# Patient Record
Sex: Male | Born: 1980 | Hispanic: Yes | Marital: Married | Smoking: Never smoker
Health system: Southern US, Community
[De-identification: ages and names within clinical notes are randomized; demographics above are authoritative.]

## PROBLEM LIST (undated history)

## (undated) DIAGNOSIS — N401 Enlarged prostate with lower urinary tract symptoms: Secondary | ICD-10-CM

## (undated) DIAGNOSIS — R519 Headache, unspecified: Secondary | ICD-10-CM

## (undated) DIAGNOSIS — N529 Male erectile dysfunction, unspecified: Secondary | ICD-10-CM

## (undated) DIAGNOSIS — N138 Other obstructive and reflux uropathy: Secondary | ICD-10-CM

## (undated) DIAGNOSIS — G4730 Sleep apnea, unspecified: Secondary | ICD-10-CM

## (undated) DIAGNOSIS — E291 Testicular hypofunction: Secondary | ICD-10-CM

## (undated) DIAGNOSIS — J40 Bronchitis, not specified as acute or chronic: Secondary | ICD-10-CM

## (undated) HISTORY — DX: Other obstructive and reflux uropathy: N13.8

## (undated) HISTORY — DX: Testicular hypofunction: E29.1

## (undated) HISTORY — DX: Male erectile dysfunction, unspecified: N52.9

## (undated) HISTORY — DX: Benign prostatic hyperplasia with lower urinary tract symptoms: N40.1

## (undated) HISTORY — PX: NO PAST SURGERIES: SHX2092

---

## 2010-10-26 ENCOUNTER — Emergency Department: Payer: Self-pay | Admitting: Emergency Medicine

## 2010-12-17 ENCOUNTER — Ambulatory Visit: Payer: Self-pay | Admitting: Neurology

## 2010-12-23 ENCOUNTER — Ambulatory Visit: Payer: Self-pay | Admitting: Neurology

## 2011-04-14 ENCOUNTER — Ambulatory Visit: Payer: Self-pay | Admitting: Neurology

## 2011-06-01 ENCOUNTER — Ambulatory Visit: Payer: Self-pay

## 2011-06-26 ENCOUNTER — Ambulatory Visit: Payer: Self-pay | Admitting: Internal Medicine

## 2011-06-26 LAB — RAPID STREP-A WITH REFLX: Micro Text Report: NEGATIVE

## 2011-06-28 LAB — BETA STREP CULTURE(ARMC)

## 2012-08-17 ENCOUNTER — Ambulatory Visit: Payer: Self-pay | Admitting: Internal Medicine

## 2012-08-17 LAB — COMPREHENSIVE METABOLIC PANEL
Anion Gap: 14 (ref 7–16)
BUN: 8 mg/dL (ref 7–18)
Calcium, Total: 9.3 mg/dL (ref 8.5–10.1)
Co2: 22 mmol/L (ref 21–32)
Creatinine: 1.16 mg/dL (ref 0.60–1.30)
EGFR (African American): >60
Glucose: 92 mg/dL (ref 65–99)
Osmolality: 277 (ref 275–301)
Potassium: 3.5 mmol/L (ref 3.5–5.1)
Sodium: 140 mmol/L (ref 136–145)

## 2012-08-17 LAB — CBC CANCER CENTER
Basophil #: 0 x10 3/mm (ref 0.0–0.1)
Basophil %: 0.4 %
Eosinophil #: 0.2 x10 3/mm (ref 0.0–0.7)
Eosinophil %: 4.7 %
HCT: 44.4 % (ref 40.0–52.0)
HGB: 14.8 g/dL (ref 13.0–18.0)
MCH: 28.4 pg (ref 26.0–34.0)
MCV: 85 fL (ref 80–100)
Monocyte #: 0.6 x10 3/mm (ref 0.2–1.0)
Monocyte %: 14.6 %
Neutrophil #: 1.7 x10 3/mm (ref 1.4–6.5)
Neutrophil %: 41.4 %
Platelet: 269 x10 3/mm (ref 150–440)
RBC: 5.22 10*6/uL (ref 4.40–5.90)
WBC: 4.1 x10 3/mm (ref 3.8–10.6)

## 2012-08-17 LAB — IRON AND TIBC
Iron Bind.Cap.(Total): 344 ug/dL (ref 250–450)
Iron: 107 ug/dL (ref 65–175)

## 2012-08-17 LAB — FERRITIN: Ferritin (ARMC): 64 ng/mL (ref 8–388)

## 2012-08-18 ENCOUNTER — Ambulatory Visit: Payer: Self-pay

## 2012-08-18 LAB — HEPATIC FUNCTION PANEL A (ARMC)
Bilirubin, Direct: 0.1 mg/dL (ref 0.00–0.20)
Total Protein: 7.9 g/dL (ref 6.4–8.2)

## 2012-08-24 LAB — CBC CANCER CENTER
Basophil #: 0 x10 3/mm (ref 0.0–0.1)
Eosinophil #: 0.2 x10 3/mm (ref 0.0–0.7)
Eosinophil %: 3.1 %
HCT: 44.6 % (ref 40.0–52.0)
HGB: 14.9 g/dL (ref 13.0–18.0)
Lymphocyte #: 1.4 x10 3/mm (ref 1.0–3.6)
Lymphocyte %: 28.6 %
MCH: 28 pg (ref 26.0–34.0)
MCV: 84 fL (ref 80–100)
Monocyte %: 11.1 %
Neutrophil #: 2.8 x10 3/mm (ref 1.4–6.5)
Neutrophil %: 56.8 %
Platelet: 224 x10 3/mm (ref 150–440)
RBC: 5.32 10*6/uL (ref 4.40–5.90)
RDW: 14.1 % (ref 11.5–14.5)

## 2012-09-04 ENCOUNTER — Ambulatory Visit: Payer: Self-pay | Admitting: Internal Medicine

## 2013-03-05 ENCOUNTER — Ambulatory Visit: Payer: Self-pay | Admitting: Internal Medicine

## 2015-01-16 DIAGNOSIS — G4700 Insomnia, unspecified: Secondary | ICD-10-CM | POA: Insufficient documentation

## 2015-01-16 DIAGNOSIS — G43019 Migraine without aura, intractable, without status migrainosus: Secondary | ICD-10-CM | POA: Insufficient documentation

## 2015-01-16 DIAGNOSIS — G4733 Obstructive sleep apnea (adult) (pediatric): Secondary | ICD-10-CM | POA: Insufficient documentation

## 2015-05-16 ENCOUNTER — Encounter: Payer: Self-pay | Admitting: *Deleted

## 2015-05-21 ENCOUNTER — Encounter: Payer: Self-pay | Admitting: Urology

## 2015-05-21 ENCOUNTER — Ambulatory Visit (INDEPENDENT_AMBULATORY_CARE_PROVIDER_SITE_OTHER): Payer: BLUE CROSS/BLUE SHIELD | Admitting: Urology

## 2015-05-21 VITALS — BP 123/72 | HR 89 | Ht 71.0 in | Wt 238.9 lb

## 2015-05-21 DIAGNOSIS — F528 Other sexual dysfunction not due to a substance or known physiological condition: Secondary | ICD-10-CM

## 2015-05-21 DIAGNOSIS — M1990 Unspecified osteoarthritis, unspecified site: Secondary | ICD-10-CM | POA: Insufficient documentation

## 2015-05-21 DIAGNOSIS — N401 Enlarged prostate with lower urinary tract symptoms: Secondary | ICD-10-CM | POA: Diagnosis not present

## 2015-05-21 DIAGNOSIS — Z8639 Personal history of other endocrine, nutritional and metabolic disease: Secondary | ICD-10-CM | POA: Insufficient documentation

## 2015-05-21 DIAGNOSIS — F5221 Male erectile disorder: Secondary | ICD-10-CM | POA: Insufficient documentation

## 2015-05-21 DIAGNOSIS — N138 Other obstructive and reflux uropathy: Secondary | ICD-10-CM
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
## 2015-05-27 ENCOUNTER — Other Ambulatory Visit: Payer: Self-pay

## 2015-05-27 DIAGNOSIS — E291 Testicular hypofunction: Secondary | ICD-10-CM

## 2015-05-28 ENCOUNTER — Other Ambulatory Visit: Payer: BLUE CROSS/BLUE SHIELD

## 2015-05-28 DIAGNOSIS — E291 Testicular hypofunction: Secondary | ICD-10-CM

## 2015-05-29 ENCOUNTER — Telehealth: Payer: Self-pay

## 2015-05-29 LAB — TESTOSTERONE: TESTOSTERONE: 764 ng/dL (ref 348–1197)

## 2015-05-29 NOTE — Telephone Encounter (Signed)
Called to speak with pt in reference to lab results. Made pt aware testosterone was within normal limits therefore he did not have hypogonadism. Pt stated that was not true and hung up.

## 2015-05-29 NOTE — Telephone Encounter (Signed)
-----   Message from Harle Battiest, PA-C sent at 05/29/2015 12:09 PM EST ----- Patient's testosterone is in normal limits. He does not have hypogonadism.

## 2015-06-18 ENCOUNTER — Other Ambulatory Visit: Payer: Self-pay | Admitting: Urology

## 2015-08-19 ENCOUNTER — Encounter: Payer: Self-pay | Admitting: *Deleted

## 2015-08-19 ENCOUNTER — Ambulatory Visit
Admission: EM | Admit: 2015-08-19 | Discharge: 2015-08-19 | Disposition: A | Discharge status: Home / Self Care | Payer: BLUE CROSS/BLUE SHIELD | Attending: Emergency Medicine | Admitting: Emergency Medicine

## 2015-08-19 DIAGNOSIS — H6592 Unspecified nonsuppurative otitis media, left ear: Secondary | ICD-10-CM | POA: Diagnosis not present

## 2015-08-19 MED ORDER — MOMETASONE FUROATE 50 MCG/ACT NA SUSP: 2.0000 | Freq: Every day | NASAL | Status: DC

## 2015-08-19 MED ORDER — AMOXICILLIN 875 MG PO TABS: 875.0000 mg | ORAL_TABLET | Freq: Two times a day (BID) | ORAL | Status: DC

## 2015-08-19 NOTE — ED Notes (Signed)
Patient started having symptoms of nasal congestion, cough, and left ear pain. Today his left ear has become more painful. Patient reports having a sinus infection 3 years ago and having left ear pain symptoms at that time.
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
## 2015-11-03 ENCOUNTER — Ambulatory Visit
Admission: EM | Admit: 2015-11-03 | Discharge: 2015-11-03 | Disposition: A | Discharge status: Home / Self Care | Payer: BLUE CROSS/BLUE SHIELD | Attending: Emergency Medicine | Admitting: Emergency Medicine

## 2015-11-03 ENCOUNTER — Ambulatory Visit (INDEPENDENT_AMBULATORY_CARE_PROVIDER_SITE_OTHER): Payer: BLUE CROSS/BLUE SHIELD

## 2015-11-03 DIAGNOSIS — M25462 Effusion, left knee: Secondary | ICD-10-CM

## 2015-11-03 DIAGNOSIS — S8392XA Sprain of unspecified site of left knee, initial encounter: Secondary | ICD-10-CM

## 2015-11-03 MED ORDER — NAPROXEN 500 MG PO TABS: 500.0000 mg | ORAL_TABLET | Freq: Two times a day (BID) | ORAL | 0 refills | Status: DC
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
## 2016-06-10 ENCOUNTER — Other Ambulatory Visit: Payer: Self-pay | Admitting: Urology

## 2016-06-10 DIAGNOSIS — N401 Enlarged prostate with lower urinary tract symptoms: Secondary | ICD-10-CM

## 2016-06-10 NOTE — Telephone Encounter (Signed)
Pharmacy sent a refill request of Cilias 5mg . 30 days and no refills were given as pt needs an appt!!!

## 2016-07-20 ENCOUNTER — Other Ambulatory Visit: Payer: Self-pay | Admitting: Urology

## 2016-07-20 DIAGNOSIS — N401 Enlarged prostate with lower urinary tract symptoms: Secondary | ICD-10-CM
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
## 2016-07-28 ENCOUNTER — Ambulatory Visit: Payer: 59 | Admitting: Urology

## 2016-07-28 ENCOUNTER — Encounter: Payer: Self-pay | Admitting: Urology

## 2016-07-28 VITALS — BP 126/83 | HR 86 | Ht 71.0 in | Wt 234.9 lb

## 2016-07-28 DIAGNOSIS — N138 Other obstructive and reflux uropathy: Secondary | ICD-10-CM

## 2016-07-28 DIAGNOSIS — F5221 Male erectile disorder: Secondary | ICD-10-CM

## 2016-07-28 DIAGNOSIS — N401 Enlarged prostate with lower urinary tract symptoms: Secondary | ICD-10-CM | POA: Diagnosis not present

## 2016-07-28 DIAGNOSIS — E349 Endocrine disorder, unspecified: Secondary | ICD-10-CM | POA: Diagnosis not present

## 2016-07-28 MED ORDER — TADALAFIL 5 MG PO TABS: 5.0000 mg | ORAL_TABLET | Freq: Every day | ORAL | 11 refills | Status: DC | PRN

## 2016-07-31 ENCOUNTER — Other Ambulatory Visit: Payer: Self-pay

## 2016-07-31 ENCOUNTER — Other Ambulatory Visit: Payer: 59

## 2016-07-31 DIAGNOSIS — E291 Testicular hypofunction: Secondary | ICD-10-CM

## 2016-08-01 LAB — TESTOSTERONE: TESTOSTERONE: 620 ng/dL (ref 264–916)

## 2016-08-03 ENCOUNTER — Telehealth: Payer: Self-pay

## 2016-08-03 NOTE — Telephone Encounter (Signed)
Spoke with pt in reference to lab results. Pt voiced understanding.  

## 2016-08-03 NOTE — Telephone Encounter (Signed)
-----   Message from Harle Battiest, PA-C sent at 08/01/2016 11:23 AM EDT ----- Please let the patient know that his testosterone level is excellent.  This is not the source of his fatigue and/or ED.

## 2017-01-26 ENCOUNTER — Telehealth: Payer: Self-pay | Admitting: Urology

## 2017-01-26 NOTE — Telephone Encounter (Signed)
Pt called [facility] in [place] and they told him they are waiting on a PA for Cialis.

## 2017-01-28 NOTE — Telephone Encounter (Signed)
Prior authorization APPROVED through Cover My Meds (Reference # [id number]).    Per Fleet Contrasachel at [facility] in [place], medication is ready for pick up at a cost of $25.  LMOM to notify patient.

## 2017-05-17 ENCOUNTER — Ambulatory Visit (INDEPENDENT_AMBULATORY_CARE_PROVIDER_SITE_OTHER): Payer: 59

## 2017-05-17 ENCOUNTER — Other Ambulatory Visit: Payer: Self-pay

## 2017-05-17 ENCOUNTER — Encounter: Payer: Self-pay | Admitting: *Deleted

## 2017-05-17 ENCOUNTER — Ambulatory Visit
Admission: EM | Admit: 2017-05-17 | Discharge: 2017-05-17 | Disposition: A | Discharge status: Home / Self Care | Payer: 59 | Attending: Family Medicine | Admitting: Family Medicine

## 2017-05-17 DIAGNOSIS — S61431A Puncture wound without foreign body of right hand, initial encounter: Secondary | ICD-10-CM | POA: Diagnosis not present

## 2017-05-17 DIAGNOSIS — Z23 Encounter for immunization: Secondary | ICD-10-CM

## 2017-05-17 DIAGNOSIS — B351 Tinea unguium: Secondary | ICD-10-CM

## 2017-05-17 DIAGNOSIS — M79672 Pain in left foot: Secondary | ICD-10-CM

## 2017-05-17 DIAGNOSIS — W450XXA Nail entering through skin, initial encounter: Secondary | ICD-10-CM | POA: Diagnosis not present

## 2017-05-17 MED ORDER — TETANUS-DIPHTH-ACELL PERTUSSIS 5-2.5-18.5 LF-MCG/0.5 IM SUSP
0.5000 mL | Freq: Once | INTRAMUSCULAR | Status: AC
  Administered 2017-05-17: 0.5 mL via INTRAMUSCULAR

## 2017-05-17 MED ORDER — NAPROXEN 500 MG PO TABS: 500.0000 mg | ORAL_TABLET | Freq: Two times a day (BID) | ORAL | 0 refills | Status: DC | PRN

## 2017-05-17 NOTE — ED Triage Notes (Signed)
Patient started having unexplained left foot pain 2 days ago. Patient punctured palm of right hand with a nail yesterday.

## 2017-05-17 NOTE — Discharge Instructions (Addendum)
You were given a Tetanus booster today. May start Naproxen 500mg  twice a day as directed for foot pain. May use warm compresses and alternate with ice as needed for comfort. Recommend continue to clean puncture wound with soap and water. May apply triple antibiotic ointment and cover with bandage. If any redness, increased pain, discharge or warmth occur, return here for recheck. Otherwise, follow-up with Dr. Ether GriffinsFowler for foot pain and treatment.
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
## 2017-05-20 ENCOUNTER — Telehealth: Payer: Self-pay | Admitting: *Deleted

## 2017-07-19 ENCOUNTER — Other Ambulatory Visit: Payer: Self-pay | Admitting: Urology

## 2017-07-20 ENCOUNTER — Telehealth: Payer: Self-pay | Admitting: Urology

## 2017-07-20 MED ORDER — TADALAFIL 5 MG PO TABS: 5.0000 mg | ORAL_TABLET | Freq: Every day | ORAL | 0 refills | Status: DC | PRN

## 2017-07-20 NOTE — Addendum Note (Signed)
Addended by: Honor LohGARRISON, Emiya Loomer M on: 07/20/2017 02:42 PM   Modules accepted: Orders

## 2017-07-20 NOTE — Telephone Encounter (Signed)
Spoke to patient. He made a follow up appointment for May 16. I gave him 1 month refill on Cialis until his appointment day.

## 2017-07-20 NOTE — Telephone Encounter (Signed)
Patient does not have a yearly follow up scheduled and is requesting refills on his Cialis. He needs to schedule a follow up appointment and then we can give him refills to last until his follow up appointment.

## 2017-07-20 NOTE — Telephone Encounter (Signed)
Medication refilled, OV scheduled

## 2017-07-20 NOTE — Telephone Encounter (Signed)
Pt called office asking for refill on medication, states pharmacy sent refill request.  Please advise.  Thanks.

## 2017-08-18 ENCOUNTER — Other Ambulatory Visit: Payer: Self-pay | Admitting: Urology

## 2017-08-18 ENCOUNTER — Telehealth: Payer: Self-pay | Admitting: Urology

## 2017-08-18 MED ORDER — TADALAFIL 5 MG PO TABS: 5.0000 mg | ORAL_TABLET | Freq: Every day | ORAL | 0 refills | Status: DC | PRN

## 2017-08-18 NOTE — Progress Notes (Signed)
Refill for Cialis sent to pharmacy
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
## 2017-08-18 NOTE — Telephone Encounter (Signed)
We moved the patient's follow up app out til June but he wants to know if he can get his refill for his Cialis called in to the [facility]?  Thanks, Marcelino Duster

## 2017-08-19 ENCOUNTER — Ambulatory Visit: Payer: Self-pay | Admitting: Urology

## 2017-09-14 ENCOUNTER — Telehealth: Payer: Self-pay | Admitting: Urology

## 2017-09-16 ENCOUNTER — Ambulatory Visit (INDEPENDENT_AMBULATORY_CARE_PROVIDER_SITE_OTHER): Payer: 59 | Admitting: Urology

## 2017-09-16 ENCOUNTER — Encounter: Payer: Self-pay | Admitting: Urology

## 2017-09-16 VITALS — BP 121/73 | HR 88 | Ht 71.0 in | Wt 253.9 lb

## 2017-09-16 DIAGNOSIS — N529 Male erectile dysfunction, unspecified: Secondary | ICD-10-CM | POA: Diagnosis not present

## 2017-09-16 DIAGNOSIS — N138 Other obstructive and reflux uropathy: Secondary | ICD-10-CM | POA: Diagnosis not present

## 2017-09-16 DIAGNOSIS — N401 Enlarged prostate with lower urinary tract symptoms: Secondary | ICD-10-CM | POA: Diagnosis not present

## 2017-09-16 MED ORDER — TADALAFIL 5 MG PO TABS: 5.0000 mg | ORAL_TABLET | Freq: Every day | ORAL | 11 refills | Status: DC | PRN
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
## 2017-09-17 ENCOUNTER — Ambulatory Visit: Payer: Self-pay | Admitting: Urology

## 2017-09-30 ENCOUNTER — Ambulatory Visit: Payer: Self-pay | Admitting: Urology

## 2017-10-05 ENCOUNTER — Ambulatory Visit
Admission: EM | Admit: 2017-10-05 | Discharge: 2017-10-05 | Disposition: A | Discharge status: Home / Self Care | Payer: 59 | Attending: Family Medicine | Admitting: Family Medicine

## 2017-10-05 ENCOUNTER — Other Ambulatory Visit: Payer: Self-pay

## 2017-10-05 ENCOUNTER — Encounter: Payer: Self-pay | Admitting: Emergency Medicine

## 2017-10-05 DIAGNOSIS — M25512 Pain in left shoulder: Secondary | ICD-10-CM | POA: Diagnosis not present

## 2017-10-05 MED ORDER — MELOXICAM 15 MG PO TABS: 15.0000 mg | ORAL_TABLET | Freq: Every day | ORAL | 0 refills | Status: DC | PRN
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
## 2017-10-05 NOTE — ED Triage Notes (Signed)
Patient c/o left shoulder pain that goes down his left arm that has been going on for a week.  Patient denies chest pain or SOB. Patient denies injury or fall.

## 2017-10-05 NOTE — Discharge Instructions (Addendum)
Likely biceps tendonitis and rotator cuff bursitis and/or tendonitis.  Medication as prescribed. Do not use other NSAID's while using it.  If persists, see Ortho ([facility]).  Take care  Dr. Adriana Simasook

## 2017-10-28 ENCOUNTER — Other Ambulatory Visit: Payer: Self-pay | Admitting: Family Medicine

## 2017-11-18 ENCOUNTER — Other Ambulatory Visit: Payer: Self-pay | Admitting: Family Medicine

## 2018-01-31 ENCOUNTER — Ambulatory Visit
Admission: EM | Admit: 2018-01-31 | Discharge: 2018-01-31 | Disposition: A | Discharge status: Home / Self Care | Payer: 59 | Attending: Family Medicine | Admitting: Family Medicine

## 2018-01-31 ENCOUNTER — Other Ambulatory Visit: Payer: Self-pay

## 2018-01-31 DIAGNOSIS — B370 Candidal stomatitis: Secondary | ICD-10-CM

## 2018-01-31 MED ORDER — NYSTATIN 100000 UNIT/ML MT SUSP
500000.0000 [IU] | Freq: Four times a day (QID) | OROMUCOSAL | 0 refills | Status: DC
Start: 2018-01-31 — End: 2018-04-01

## 2018-01-31 NOTE — Discharge Instructions (Signed)
Follow up with primary care for routine physical and labs

## 2018-01-31 NOTE — ED Triage Notes (Signed)
Patient complains of pain on tongue. Patient states that he has been noticing this for a month or longer. States that he has a sensitivity like area at the back of his tongue.
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
## 2018-02-15 ENCOUNTER — Telehealth: Payer: Self-pay

## 2018-02-15 ENCOUNTER — Telehealth: Payer: Self-pay | Admitting: Urology

## 2018-02-15 NOTE — Telephone Encounter (Signed)
Prior authorization for tadalafil 5mg  was initiated via covermymeds.  Waiting response.

## 2018-02-15 NOTE — Telephone Encounter (Signed)
[facility] in [place] is waiting on a prior auth for Cialis.  Pt is completely out.

## 2018-02-16 NOTE — Telephone Encounter (Signed)
Patient picked up cialis rx using discount card.

## 2018-03-15 ENCOUNTER — Telehealth: Payer: Self-pay | Admitting: Urology

## 2018-03-15 NOTE — Telephone Encounter (Signed)
Prior authorization for cialis has been reprocessed through covermymeds.  Waiting response.

## 2018-03-15 NOTE — Telephone Encounter (Signed)
Insurance company denied pt's claim for Cialis because the claim didn't have BPH on it.  Please call insurance company or do electronically.  They have been back and forth for about a month.  Pt would like for it either to be called in or sent electronically with BPH added.  Insurance co# [phone] case# [id number]

## 2018-03-16 ENCOUNTER — Telehealth: Payer: Self-pay

## 2018-03-16 NOTE — Telephone Encounter (Signed)
Cialis has been approved.  ID [id number].  Patient aware.

## 2018-03-16 NOTE — Telephone Encounter (Signed)
Cialis approved.  Patient aware.

## 2018-04-01 ENCOUNTER — Ambulatory Visit
Admission: EM | Admit: 2018-04-01 | Discharge: 2018-04-01 | Disposition: A | Discharge status: Home / Self Care | Payer: 59 | Attending: Family Medicine | Admitting: Family Medicine

## 2018-04-01 ENCOUNTER — Encounter: Payer: Self-pay | Admitting: Gynecology

## 2018-04-01 DIAGNOSIS — J40 Bronchitis, not specified as acute or chronic: Secondary | ICD-10-CM | POA: Insufficient documentation

## 2018-04-01 MED ORDER — PREDNISONE 20 MG PO TABS: 40.0000 mg | ORAL_TABLET | Freq: Every day | ORAL | 0 refills | Status: AC

## 2018-04-01 MED ORDER — AZITHROMYCIN 250 MG PO TABS: 250.0000 mg | ORAL_TABLET | Freq: Every day | ORAL | 0 refills | Status: DC

## 2018-04-01 MED ORDER — BENZONATATE 100 MG PO CAPS: 100.0000 mg | ORAL_CAPSULE | Freq: Three times a day (TID) | ORAL | 0 refills | Status: DC | PRN

## 2018-04-01 MED ORDER — ALBUTEROL SULFATE 108 (90 BASE) MCG/ACT IN AEPB: 1.0000 | INHALATION_SPRAY | Freq: Four times a day (QID) | RESPIRATORY_TRACT | 0 refills | Status: DC | PRN

## 2018-04-01 NOTE — ED Triage Notes (Signed)
Patient c/o cold symptoms x 1 week. Per patient with cough / chest congestion.
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
## 2018-04-01 NOTE — Discharge Instructions (Signed)
Take medications as prescribed. Also start Mucinex DM 12-hour formula twice a day. This medication is available over the counter. You may take tylenol or ibuprofen as needed. Drink plenty of fluids.

## 2018-04-22 NOTE — Telephone Encounter (Signed)
error 

## 2018-09-05 ENCOUNTER — Other Ambulatory Visit: Payer: Self-pay | Admitting: Urology

## 2018-09-05 MED ORDER — TADALAFIL 5 MG PO TABS: 5.0000 mg | ORAL_TABLET | Freq: Every day | ORAL | 0 refills | Status: DC | PRN

## 2018-09-05 NOTE — Telephone Encounter (Signed)
Patient notified Rx sent with no refills.

## 2018-09-05 NOTE — Telephone Encounter (Signed)
Patient called and left a voicemail asking for a refill on his cialis. He has a follow up app on 09-21-18 for his 1 year check up.   Thanks, Marcelino Duster

## 2018-09-10 ENCOUNTER — Other Ambulatory Visit: Payer: Self-pay | Admitting: Urology
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
## 2018-09-21 ENCOUNTER — Other Ambulatory Visit: Payer: Self-pay

## 2018-09-21 ENCOUNTER — Ambulatory Visit: Payer: 59 | Admitting: Urology

## 2018-09-21 ENCOUNTER — Encounter: Payer: Self-pay | Admitting: Urology

## 2018-09-21 VITALS — BP 142/84 | HR 96 | Ht 71.0 in | Wt 242.0 lb

## 2018-09-21 DIAGNOSIS — N529 Male erectile dysfunction, unspecified: Secondary | ICD-10-CM | POA: Diagnosis not present

## 2018-09-21 DIAGNOSIS — N138 Other obstructive and reflux uropathy: Secondary | ICD-10-CM

## 2018-09-21 DIAGNOSIS — N401 Enlarged prostate with lower urinary tract symptoms: Secondary | ICD-10-CM

## 2018-09-21 MED ORDER — TADALAFIL 5 MG PO TABS: 5.0000 mg | ORAL_TABLET | Freq: Every day | ORAL | 11 refills | Status: AC | PRN

## 2018-09-21 NOTE — Patient Instructions (Addendum)
Cystoscopy  Cystoscopy is a procedure that is used to help diagnose and sometimes treat conditions that affect that lower urinary tract. The lower urinary tract includes the bladder and the tube that drains urine from the bladder out of the body (urethra). Cystoscopy is performed with a thin, tube-shaped instrument with a light and camera at the end (cystoscope). The cystoscope may be hard (rigid) or flexible, depending on the goal of the procedure.The cystoscope is inserted through the urethra, into the bladder. Cystoscopy may be recommended if you have:  Urinary tractinfections that keep coming back (recurring).  Blood in the urine (hematuria).  Loss of bladder control (urinary incontinence) or an overactive bladder.  Unusual cells found in a urine sample.  A blockage in the urethra.  Painful urination.  An abnormality in the bladder found during an intravenous pyelogram (IVP) or CT scan. Cystoscopy may also be done to remove a sample of tissue to be examined under a microscope (biopsy). Tell a health care provider about:  Any allergies you have.  All medicines you are taking, including vitamins, herbs, eye drops, creams, and over-the-counter medicines.  Any problems you or family members have had with anesthetic medicines.  Any blood disorders you have.  Any surgeries you have had.  Any medical conditions you have.  Whether you are pregnant or may be pregnant. What are the risks? Generally, this is a safe procedure. However, problems may occur, including:  Infection.  Bleeding.  Allergic reactions to medicines.  Damage to other structures or organs. What happens before the procedure?  Ask your health care provider about: ? Changing or stopping your regular medicines. This is especially important if you are taking diabetes medicines or blood thinners. ? Taking medicines such as aspirin and ibuprofen. These medicines can thin your blood. Do not take these medicines  before your procedure if your health care provider instructs you not to.  Follow instructions from your health care provider about eating or drinking restrictions.  You may be given antibiotic medicine to help prevent infection.  You may have an exam or testing, such as X-rays of the bladder, urethra, or kidneys.  You may have urine tests to check for signs of infection.  Plan to have someone take you home after the procedure. What happens during the procedure?  To reduce your risk of infection,your health care team will wash or sanitize their hands.  You will be given one or more of the following: ? A medicine to help you relax (sedative). ? A medicine to numb the area (local anesthetic).  The area around the opening of your urethra will be cleaned.  The cystoscope will be passed through your urethra into your bladder.  Germ-free (sterile)fluid will flow through the cystoscope to fill your bladder. The fluid will stretch your bladder so that your surgeon can clearly examine your bladder walls.  The cystoscope will be removed and your bladder will be emptied. The procedure may vary among health care providers and hospitals. What happens after the procedure?  You may have some soreness or pain in your abdomen and urethra. Medicines will be available to help you.  You may have some blood in your urine.  Do not drive for 24 hours if you received a sedative. This information is not intended to replace advice given to you by your health care provider. Make sure you discuss any questions you have with your health care provider. Document Released: 03/20/2000 Document Revised: 01/01/2017 Document Reviewed: 02/07/2015 Elsevier Interactive Patient   Education  2019 Elsevier In            Phone number to PT [phone]

## 2018-10-21 ENCOUNTER — Other Ambulatory Visit: Payer: Self-pay | Admitting: Orthopedic Surgery

## 2018-10-21 DIAGNOSIS — G8929 Other chronic pain: Secondary | ICD-10-CM

## 2018-10-21 DIAGNOSIS — M2391 Unspecified internal derangement of right knee: Secondary | ICD-10-CM

## 2018-10-21 DIAGNOSIS — M2351 Chronic instability of knee, right knee: Secondary | ICD-10-CM

## 2018-10-21 DIAGNOSIS — M25361 Other instability, right knee: Secondary | ICD-10-CM

## 2018-10-25 ENCOUNTER — Other Ambulatory Visit: Payer: Self-pay

## 2018-10-25 ENCOUNTER — Ambulatory Visit
Admission: RE | Admit: 2018-10-25 | Discharge: 2018-10-25 | Disposition: A | Discharge status: Home / Self Care | Payer: 59 | Source: Ambulatory Visit | Attending: Orthopedic Surgery | Admitting: Orthopedic Surgery

## 2018-10-25 DIAGNOSIS — M25361 Other instability, right knee: Secondary | ICD-10-CM | POA: Insufficient documentation

## 2018-10-25 DIAGNOSIS — M2351 Chronic instability of knee, right knee: Secondary | ICD-10-CM | POA: Insufficient documentation

## 2018-10-25 DIAGNOSIS — M25561 Pain in right knee: Secondary | ICD-10-CM | POA: Diagnosis present

## 2018-10-25 DIAGNOSIS — M2391 Unspecified internal derangement of right knee: Secondary | ICD-10-CM | POA: Insufficient documentation

## 2018-10-25 DIAGNOSIS — G8929 Other chronic pain: Secondary | ICD-10-CM | POA: Diagnosis present

## 2018-12-15 ENCOUNTER — Other Ambulatory Visit: Payer: Self-pay | Admitting: Orthopedic Surgery

## 2018-12-15 DIAGNOSIS — M238X1 Other internal derangements of right knee: Secondary | ICD-10-CM

## 2018-12-27 ENCOUNTER — Ambulatory Visit
Admission: RE | Admit: 2018-12-27 | Discharge: 2018-12-27 | Disposition: A | Discharge status: Home / Self Care | Payer: 59 | Source: Ambulatory Visit | Attending: Orthopedic Surgery | Admitting: Orthopedic Surgery

## 2018-12-27 ENCOUNTER — Other Ambulatory Visit: Payer: Self-pay

## 2018-12-27 DIAGNOSIS — M238X1 Other internal derangements of right knee: Secondary | ICD-10-CM | POA: Diagnosis not present

## 2018-12-29 ENCOUNTER — Other Ambulatory Visit: Payer: Self-pay | Admitting: Orthopedic Surgery

## 2018-12-29 DIAGNOSIS — M25569 Pain in unspecified knee: Secondary | ICD-10-CM

## 2018-12-29 NOTE — Progress Notes (Signed)
Bilateral standing hip to ankle x-rays on one cassette for evaluation of alignment 

## 2019-01-03 ENCOUNTER — Ambulatory Visit
Admission: RE | Admit: 2019-01-03 | Discharge: 2019-01-03 | Disposition: A | Discharge status: Home / Self Care | Payer: 59 | Attending: Orthopedic Surgery | Admitting: Orthopedic Surgery

## 2019-01-03 ENCOUNTER — Other Ambulatory Visit: Payer: Self-pay

## 2019-01-03 ENCOUNTER — Ambulatory Visit
Admission: RE | Admit: 2019-01-03 | Discharge: 2019-01-03 | Disposition: A | Discharge status: Home / Self Care | Payer: 59 | Source: Ambulatory Visit | Attending: Orthopedic Surgery | Admitting: Orthopedic Surgery

## 2019-01-03 DIAGNOSIS — M25569 Pain in unspecified knee: Secondary | ICD-10-CM

## 2019-01-05 ENCOUNTER — Ambulatory Visit
Admission: RE | Admit: 2019-01-05 | Discharge: 2019-01-05 | Disposition: A | Discharge status: Home / Self Care | Payer: 59 | Source: Ambulatory Visit | Attending: Orthopedic Surgery | Admitting: Orthopedic Surgery

## 2019-01-05 DIAGNOSIS — M25569 Pain in unspecified knee: Secondary | ICD-10-CM | POA: Diagnosis present

## 2019-01-18 ENCOUNTER — Other Ambulatory Visit
Admission: RE | Admit: 2019-01-18 | Discharge: 2019-01-18 | Disposition: A | Discharge status: Home / Self Care | Payer: 59 | Source: Ambulatory Visit | Attending: Orthopedic Surgery | Admitting: Orthopedic Surgery

## 2019-01-19 ENCOUNTER — Other Ambulatory Visit: Admission: RE | Admit: 2019-01-19 | Payer: 59 | Source: Ambulatory Visit

## 2019-02-01 ENCOUNTER — Other Ambulatory Visit: Payer: Self-pay | Admitting: Orthopedic Surgery

## 2019-02-06 ENCOUNTER — Encounter
Admission: RE | Admit: 2019-02-06 | Discharge: 2019-02-06 | Disposition: A | Discharge status: Home / Self Care | Payer: 59 | Source: Ambulatory Visit | Attending: Orthopedic Surgery | Admitting: Orthopedic Surgery

## 2019-02-06 DIAGNOSIS — Z01818 Encounter for other preprocedural examination: Secondary | ICD-10-CM | POA: Diagnosis present

## 2019-02-06 HISTORY — DX: Bronchitis, not specified as acute or chronic: J40

## 2019-02-06 HISTORY — DX: Sleep apnea, unspecified: G47.30

## 2019-02-06 HISTORY — DX: Headache, unspecified: R51.9

## 2019-02-06 NOTE — Patient Instructions (Signed)
Your procedure is scheduled on: 02-10-19 FRIDAY Report to Same Day Surgery 2nd floor medical mall [facility] Entrance-take elevator on left to 2nd floor.  Check in with surgery information desk.) To find out your arrival time please call [phone] between 1PM - 3PM on 02-09-19 THURSDAY  Remember: Instructions that are not followed completely may result in serious medical risk, up to and including death, or upon the discretion of your surgeon and anesthesiologist your surgery may need to be rescheduled.    _x___ 1. Do not eat food after midnight the night before your procedure. NO GUM OR CANDY AFTER MIDNIGHT. You may drink clear liquids up to 2 hours before you are scheduled to arrive at the hospital for your procedure.  Do not drink clear liquids within 2 hours of your scheduled arrival to the hospital.  Clear liquids include  --Water or Apple juice without pulp  --Gatorade  --Black Coffee or Clear Tea (No milk, no creamers, do not add anything to the coffee or Tea   ____Ensure clear carbohydrate drink on the way to the hospital for bariatric patients  ____Ensure clear carbohydrate drink 3 hours before surgery.     __x__ 2. No Alcohol for 24 hours before or after surgery.   __x__3. No Smoking or e-cigarettes for 24 prior to surgery.  Do not use any chewable tobacco products for at least 6 hour prior to surgery   ____  4. Bring all medications with you on the day of surgery if instructed.    __x__ 5. Notify your doctor if there is any change in your medical condition     (cold, fever, infections).    x___6. On the morning of surgery brush your teeth with toothpaste and water.  You may rinse your mouth with mouth wash if you wish.  Do not swallow any toothpaste or mouthwash.   Do not wear jewelry, make-up, hairpins, clips or nail polish.  Do not wear lotions, powders, or perfumes.   Do not shave 48 hours prior to surgery. Men may shave face and neck.  Do not bring valuables to  the hospital.    [facility] is not responsible for any belongings or valuables.               Contacts, dentures or bridgework may not be worn into surgery.  Leave your suitcase in the car. After surgery it may be brought to your room.  For patients admitted to the hospital, discharge time is determined by your treatment team.  _  Patients discharged the day of surgery will not be allowed to drive home.  You will need someone to drive you home and stay with you the night of your procedure.    Please read over the following fact sheets that you were given:   [facility] Preparing for Surgery    ____ Take anti-hypertensive listed below, cardiac, seizure, asthma, anti-reflux and psychiatric medicines. These include:  1. NONE  2.  3.  4.  5.  6.  ____Fleets enema or Magnesium Citrate as directed.   _x___ Use CHG Soap or sage wipes as directed on instruction sheet   ____ Use inhalers on the day of surgery and bring to hospital day of surgery  ____ Stop Metformin and Janumet 2 days prior to surgery.    ____ Take 1/2 of usual insulin dose the night before surgery and none on the morning surgery.   ____ Follow recommendations from Cardiologist, Pulmonologist or PCP regarding stopping Aspirin,  Coumadin, Plavix ,Eliquis, Effient, or Pradaxa, and Pletal.  X____Stop Anti-inflammatories such as Advil, Aleve, Ibuprofen, Motrin, Naproxen, MOBIC, Naprosyn, Goodies powders, EXCEDRIN MIGRAINE or aspirin products NOW-OK to take Tylenol    ____ Stop supplements until after surgery.    ____ Bring C-Pap to the hospital.

## 2019-02-07 ENCOUNTER — Other Ambulatory Visit
Admission: RE | Admit: 2019-02-07 | Discharge: 2019-02-07 | Disposition: A | Discharge status: Home / Self Care | Payer: 59 | Source: Ambulatory Visit | Attending: Orthopedic Surgery | Admitting: Orthopedic Surgery

## 2019-02-07 ENCOUNTER — Other Ambulatory Visit: Payer: Self-pay

## 2019-02-07 DIAGNOSIS — Z20828 Contact with and (suspected) exposure to other viral communicable diseases: Secondary | ICD-10-CM | POA: Insufficient documentation

## 2019-02-07 DIAGNOSIS — Z01812 Encounter for preprocedural laboratory examination: Secondary | ICD-10-CM | POA: Diagnosis present

## 2019-02-07 LAB — BASIC METABOLIC PANEL
Anion gap: 9 (ref 5–15)
BUN: 17 mg/dL (ref 6–20)
CO2: 24 mmol/L (ref 22–32)
Calcium: 9.3 mg/dL (ref 8.9–10.3)
Chloride: 105 mmol/L (ref 98–111)
Creatinine, Ser: 0.83 mg/dL (ref 0.61–1.24)
GFR calc Af Amer: >60 mL/min (ref >60)
GFR calc non Af Amer: >60 mL/min (ref >60)
Glucose, Bld: 136 mg/dL — ABNORMAL HIGH (ref 70–99)
Potassium: 3.7 mmol/L (ref 3.5–5.1)
Sodium: 138 mmol/L (ref 135–145)

## 2019-02-07 LAB — CBC
HCT: 44.7 % (ref 39.0–52.0)
Hemoglobin: 15.1 g/dL (ref 13.0–17.0)
MCH: 29.1 pg (ref 26.0–34.0)
MCHC: 33.8 g/dL (ref 30.0–36.0)
MCV: 86.1 fL (ref 80.0–100.0)
Platelets: 244 10*3/uL (ref 150–400)
RBC: 5.19 MIL/uL (ref 4.22–5.81)
RDW: 13 % (ref 11.5–15.5)
WBC: 4.9 10*3/uL (ref 4.0–10.5)
nRBC: 0 % (ref 0.0–0.2)

## 2019-02-07 LAB — SARS CORONAVIRUS 2 (TAT 6-24 HRS): SARS Coronavirus 2: NEGATIVE

## 2019-02-10 ENCOUNTER — Other Ambulatory Visit: Payer: Self-pay

## 2019-02-10 ENCOUNTER — Ambulatory Visit: Payer: 59

## 2019-02-10 ENCOUNTER — Encounter: Payer: Self-pay | Admitting: Emergency Medicine

## 2019-02-10 ENCOUNTER — Ambulatory Visit: Payer: 59 | Admitting: Anesthesiology

## 2019-02-10 ENCOUNTER — Encounter
Admission: RE | Disposition: A | Discharge status: Home / Self Care | Payer: Self-pay | Source: Home / Self Care | Attending: Orthopedic Surgery

## 2019-02-10 ENCOUNTER — Ambulatory Visit
Admission: RE | Admit: 2019-02-10 | Discharge: 2019-02-10 | Disposition: A | Discharge status: Home / Self Care | Payer: 59 | Attending: Orthopedic Surgery | Admitting: Orthopedic Surgery

## 2019-02-10 DIAGNOSIS — S83241A Other tear of medial meniscus, current injury, right knee, initial encounter: Secondary | ICD-10-CM | POA: Insufficient documentation

## 2019-02-10 DIAGNOSIS — M65861 Other synovitis and tenosynovitis, right lower leg: Secondary | ICD-10-CM | POA: Insufficient documentation

## 2019-02-10 DIAGNOSIS — X58XXXA Exposure to other specified factors, initial encounter: Secondary | ICD-10-CM | POA: Diagnosis not present

## 2019-02-10 DIAGNOSIS — G4730 Sleep apnea, unspecified: Secondary | ICD-10-CM | POA: Diagnosis not present

## 2019-02-10 DIAGNOSIS — Z79899 Other long term (current) drug therapy: Secondary | ICD-10-CM | POA: Diagnosis not present

## 2019-02-10 DIAGNOSIS — M21861 Other specified acquired deformities of right lower leg: Secondary | ICD-10-CM | POA: Diagnosis not present

## 2019-02-10 HISTORY — PX: KNEE ARTHROSCOPY WITH OSTEOCHONDRAL DEFECT REPAIR: SHX6579

## 2019-02-10 SURGERY — ARTHROSCOPY, KNEE, WITH OSTEOCHONDRAL DEFECT REPAIR
Anesthesia: General | Site: Knee | Laterality: Right

## 2019-02-10 MED ORDER — FENTANYL CITRATE (PF) 100 MCG/2ML IJ SOLN
INTRAMUSCULAR | Status: AC
  Filled 2019-02-10: qty 2

## 2019-02-10 MED ORDER — BUPIVACAINE HCL (PF) 0.5 % IJ SOLN
INTRAMUSCULAR | Status: DC | PRN
  Administered 2019-02-10: 5 mL

## 2019-02-10 MED ORDER — MIDAZOLAM HCL 2 MG/2ML IJ SOLN
INTRAMUSCULAR | Status: AC
  Filled 2019-02-10: qty 2

## 2019-02-10 MED ORDER — MIDAZOLAM HCL 2 MG/2ML IJ SOLN
INTRAMUSCULAR | Status: DC | PRN
  Administered 2019-02-10: 2 mg via INTRAVENOUS

## 2019-02-10 MED ORDER — LIDOCAINE-EPINEPHRINE 1 %-1:100000 IJ SOLN
INTRAMUSCULAR | Status: DC | PRN
  Administered 2019-02-10: 5 mL

## 2019-02-10 MED ORDER — LACTATED RINGERS IV SOLN
INTRAVENOUS | Status: DC
  Administered 2019-02-10 (×2): via INTRAVENOUS

## 2019-02-10 MED ORDER — ONDANSETRON HCL 4 MG/2ML IJ SOLN
INTRAMUSCULAR | Status: AC
  Filled 2019-02-10: qty 2

## 2019-02-10 MED ORDER — PROPOFOL 10 MG/ML IV BOLUS
INTRAVENOUS | Status: AC
  Filled 2019-02-10: qty 20

## 2019-02-10 MED ORDER — LIDOCAINE-EPINEPHRINE 1 %-1:100000 IJ SOLN
INTRAMUSCULAR | Status: AC
  Filled 2019-02-10: qty 1

## 2019-02-10 MED ORDER — BUPIVACAINE LIPOSOME 1.3 % IJ SUSP
INTRAMUSCULAR | Status: AC
  Filled 2019-02-10: qty 20

## 2019-02-10 MED ORDER — EPHEDRINE SULFATE 50 MG/ML IJ SOLN
INTRAMUSCULAR | Status: DC | PRN
  Administered 2019-02-10: 10 mg via INTRAVENOUS

## 2019-02-10 MED ORDER — LIDOCAINE HCL (CARDIAC) PF 100 MG/5ML IV SOSY
PREFILLED_SYRINGE | INTRAVENOUS | Status: DC | PRN
  Administered 2019-02-10: 60 mg via INTRAVENOUS

## 2019-02-10 MED ORDER — ROPIVACAINE HCL 5 MG/ML IJ SOLN
INTRAMUSCULAR | Status: AC
  Filled 2019-02-10: qty 30

## 2019-02-10 MED ORDER — FAMOTIDINE 20 MG PO TABS
ORAL_TABLET | ORAL | Status: AC
  Filled 2019-02-10: qty 1

## 2019-02-10 MED ORDER — FENTANYL CITRATE (PF) 100 MCG/2ML IJ SOLN
INTRAMUSCULAR | Status: DC | PRN
  Administered 2019-02-10 (×2): 25 ug via INTRAVENOUS
  Administered 2019-02-10: 50 ug via INTRAVENOUS
  Administered 2019-02-10 (×6): 25 ug via INTRAVENOUS

## 2019-02-10 MED ORDER — BUPIVACAINE HCL (PF) 0.5 % IJ SOLN
INTRAMUSCULAR | Status: DC | PRN
  Administered 2019-02-10: 20 mL via PERINEURAL

## 2019-02-10 MED ORDER — OXYCODONE HCL 5 MG PO TABS
ORAL_TABLET | ORAL | Status: AC
  Filled 2019-02-10: qty 1

## 2019-02-10 MED ORDER — OXYCODONE HCL 5 MG PO TABS
5.0000 mg | ORAL_TABLET | Freq: Once | ORAL | Status: AC
  Administered 2019-02-10: 13:00:00 5 mg via ORAL

## 2019-02-10 MED ORDER — SODIUM CHLORIDE FLUSH 0.9 % IV SOLN
INTRAVENOUS | Status: AC
  Filled 2019-02-10: qty 40

## 2019-02-10 MED ORDER — BUPIVACAINE HCL (PF) 0.5 % IJ SOLN
INTRAMUSCULAR | Status: AC
  Filled 2019-02-10: qty 30

## 2019-02-10 MED ORDER — LIDOCAINE HCL (PF) 1 % IJ SOLN
INTRAMUSCULAR | Status: AC
  Filled 2019-02-10: qty 5

## 2019-02-10 MED ORDER — OXYCODONE HCL 5 MG PO TABS
5.0000 mg | ORAL_TABLET | Freq: Once | ORAL | Status: AC
  Administered 2019-02-10: 13:00:00 5 mg via ORAL
  Filled 2019-02-10: qty 1

## 2019-02-10 MED ORDER — FAMOTIDINE 20 MG PO TABS
20.0000 mg | ORAL_TABLET | Freq: Once | ORAL | Status: AC
  Administered 2019-02-10: 06:00:00 20 mg via ORAL

## 2019-02-10 MED ORDER — FENTANYL CITRATE (PF) 100 MCG/2ML IJ SOLN
INTRAMUSCULAR | Status: AC
  Administered 2019-02-10: 25 ug via INTRAVENOUS
  Filled 2019-02-10: qty 2

## 2019-02-10 MED ORDER — DEXTROSE 5 % IV SOLN
3.0000 g | Freq: Once | INTRAVENOUS | Status: AC
  Administered 2019-02-10: 3 g via INTRAVENOUS
  Filled 2019-02-10: qty 3

## 2019-02-10 MED ORDER — ACETAMINOPHEN 10 MG/ML IV SOLN
INTRAVENOUS | Status: DC | PRN
  Administered 2019-02-10: 1000 mg via INTRAVENOUS

## 2019-02-10 MED ORDER — LIDOCAINE HCL (PF) 2 % IJ SOLN
INTRAMUSCULAR | Status: AC
  Filled 2019-02-10: qty 10

## 2019-02-10 MED ORDER — ASPIRIN EC 325 MG PO TBEC: 325.0000 mg | DELAYED_RELEASE_TABLET | Freq: Every day | ORAL | 0 refills | Status: AC

## 2019-02-10 MED ORDER — ACETAMINOPHEN 10 MG/ML IV SOLN
INTRAVENOUS | Status: AC
  Filled 2019-02-10: qty 100

## 2019-02-10 MED ORDER — FENTANYL CITRATE (PF) 100 MCG/2ML IJ SOLN
25.0000 ug | INTRAMUSCULAR | Status: AC | PRN
  Administered 2019-02-10 (×6): 25 ug via INTRAVENOUS

## 2019-02-10 MED ORDER — OXYCODONE HCL 5 MG PO TABS
ORAL_TABLET | ORAL | Status: AC
  Administered 2019-02-10: 13:00:00 5 mg via ORAL
  Filled 2019-02-10: qty 1

## 2019-02-10 MED ORDER — EPINEPHRINE PF 1 MG/ML IJ SOLN
INTRAMUSCULAR | Status: AC
  Filled 2019-02-10: qty 1

## 2019-02-10 MED ORDER — FENTANYL CITRATE (PF) 100 MCG/2ML IJ SOLN
INTRAMUSCULAR | Status: AC
  Administered 2019-02-10: 12:00:00 25 ug via INTRAVENOUS
  Filled 2019-02-10: qty 2

## 2019-02-10 MED ORDER — OXYCODONE HCL 5 MG PO TABS: 5.0000 mg | ORAL_TABLET | ORAL | 0 refills | Status: AC | PRN

## 2019-02-10 MED ORDER — PHENYLEPHRINE HCL (PRESSORS) 10 MG/ML IV SOLN
INTRAVENOUS | Status: DC | PRN
  Administered 2019-02-10 (×2): 100 ug via INTRAVENOUS

## 2019-02-10 MED ORDER — ACETAMINOPHEN 500 MG PO TABS: 1000.0000 mg | ORAL_TABLET | Freq: Three times a day (TID) | ORAL | 2 refills | Status: AC

## 2019-02-10 MED ORDER — ONDANSETRON HCL 4 MG/2ML IJ SOLN
INTRAMUSCULAR | Status: DC | PRN
  Administered 2019-02-10: 4 mg via INTRAVENOUS

## 2019-02-10 MED ORDER — ONDANSETRON HCL 4 MG/2ML IJ SOLN: 4.0000 mg | Freq: Once | INTRAMUSCULAR | Status: DC | PRN

## 2019-02-10 MED ORDER — PROPOFOL 10 MG/ML IV BOLUS
INTRAVENOUS | Status: DC | PRN
  Administered 2019-02-10: 20 mg via INTRAVENOUS
  Administered 2019-02-10: 180 mg via INTRAVENOUS

## 2019-02-10 MED ORDER — SEVOFLURANE IN SOLN
RESPIRATORY_TRACT | Status: AC
  Filled 2019-02-10: qty 250

## 2019-02-10 MED ORDER — FENTANYL CITRATE (PF) 100 MCG/2ML IJ SOLN
50.0000 ug | Freq: Once | INTRAMUSCULAR | Status: AC
  Administered 2019-02-10: 12:00:00 50 ug via INTRAVENOUS

## 2019-02-10 MED ORDER — ONDANSETRON 4 MG PO TBDP: 4.0000 mg | ORAL_TABLET | Freq: Three times a day (TID) | ORAL | 0 refills | Status: DC | PRN

## 2019-02-10 MED ORDER — PHENYLEPHRINE HCL (PRESSORS) 10 MG/ML IV SOLN
INTRAVENOUS | Status: AC
  Filled 2019-02-10: qty 1

## 2019-02-10 MED ORDER — EPHEDRINE SULFATE 50 MG/ML IJ SOLN
INTRAMUSCULAR | Status: AC
  Filled 2019-02-10: qty 1

## 2019-02-10 SURGICAL SUPPLY — 84 items
"PENCIL ELECTRO HAND CTR " (MISCELLANEOUS) ×1 IMPLANT
ADAPTER IRRIG TUBE 2 SPIKE SOL (ADAPTER) ×3 IMPLANT
BLADE OSC/SAGITTAL MD 9X18.5 (BLADE) ×1 IMPLANT
BLADE SURG SZ10 CARB STEEL (BLADE) ×2 IMPLANT
BLADE SURG SZ11 CARB STEEL (BLADE) ×2 IMPLANT
BNDG ELASTIC 6X5.8 VLCR STR LF (GAUZE/BANDAGES/DRESSINGS) ×1 IMPLANT
BNDG ESMARK 6X12 TAN STRL LF (GAUZE/BANDAGES/DRESSINGS) ×2 IMPLANT
BRUSH SCRUB EZ  4% CHG (MISCELLANEOUS) ×1
BRUSH SCRUB EZ 4% CHG (MISCELLANEOUS) ×1 IMPLANT
BUR RADIUS 3.5 (BURR) ×2 IMPLANT
BUR RADIUS 4.0X18.5 (BURR) ×2 IMPLANT
CHLORAPREP W/TINT 26 (MISCELLANEOUS) ×2 IMPLANT
CNTNR SPEC 2.5X3XGRAD LEK (MISCELLANEOUS) ×1
CONT SPEC 4OZ STER OR WHT (MISCELLANEOUS) ×1
CONTAINER SPEC 2.5X3XGRAD LEK (MISCELLANEOUS) IMPLANT
COOLER POLAR GLACIER W/PUMP (MISCELLANEOUS) ×2 IMPLANT
COVER WAND RF STERILE (DRAPES) ×2 IMPLANT
CUFF TOURN SGL QUICK 24 (TOURNIQUET CUFF)
CUFF TOURN SGL QUICK 30 (TOURNIQUET CUFF)
CUFF TOURN SGL QUICK 34 (TOURNIQUET CUFF) ×1
CUFF TRNQT CYL 24X4X16.5-23 (TOURNIQUET CUFF) IMPLANT
CUFF TRNQT CYL 30X4X21-28X (TOURNIQUET CUFF) IMPLANT
CUFF TRNQT CYL 34X4.125X (TOURNIQUET CUFF) IMPLANT
DERMABOND ADVANCED (GAUZE/BANDAGES/DRESSINGS) ×1
DERMABOND ADVANCED .7 DNX12 (GAUZE/BANDAGES/DRESSINGS) IMPLANT
DRAPE IMP U-DRAPE 54X76 (DRAPES) ×1 IMPLANT
DRAPE LEGGINS SURG 28X43 STRL (DRAPES) ×2 IMPLANT
DRAPE SPLIT 6X30 W/TAPE (DRAPES) ×2 IMPLANT
ELECT REM PT RETURN 9FT ADLT (ELECTROSURGICAL) ×2
ELECTRODE REM PT RTRN 9FT ADLT (ELECTROSURGICAL) ×1 IMPLANT
GAUZE SPONGE 4X4 12PLY STRL (GAUZE/BANDAGES/DRESSINGS) ×2 IMPLANT
GLOVE BIOGEL PI IND STRL 8 (GLOVE) ×1 IMPLANT
GLOVE BIOGEL PI INDICATOR 8 (GLOVE) ×1
GLOVE SURG SYN 7.5  E (GLOVE) ×1
GLOVE SURG SYN 7.5 E (GLOVE) ×1 IMPLANT
GLOVE SURG SYN 7.5 PF PI (GLOVE) ×1 IMPLANT
GOWN STRL REUS W/ TWL LRG LVL3 (GOWN DISPOSABLE) ×1 IMPLANT
GOWN STRL REUS W/ TWL XL LVL3 (GOWN DISPOSABLE) ×1 IMPLANT
GOWN STRL REUS W/TWL LRG LVL3 (GOWN DISPOSABLE) ×1
GOWN STRL REUS W/TWL XL LVL3 (GOWN DISPOSABLE) ×1
GRAFT CORE OSTEOCHONDRAL FRESH (Tissue) ×1 IMPLANT
IV LACTATED RINGER IRRG 3000ML (IV SOLUTION) ×4
IV LR IRRIG 3000ML ARTHROMATIC (IV SOLUTION) ×4 IMPLANT
KIT OATS ALLOGRAFT PLUG 16 (KITS) ×1 IMPLANT
KIT TURNOVER KIT A (KITS) ×2 IMPLANT
MANIFOLD NEPTUNE II (INSTRUMENTS) ×2 IMPLANT
MAT ABSORB  FLUID 56X50 GRAY (MISCELLANEOUS) ×1
MAT ABSORB FLUID 56X50 GRAY (MISCELLANEOUS) ×1 IMPLANT
NDL MAYO 6 CRC TAPER PT (NEEDLE) IMPLANT
NEEDLE HYPO 22GX1.5 SAFETY (NEEDLE) ×2 IMPLANT
NEEDLE MAYO 6 CRC TAPER PT (NEEDLE) IMPLANT
PACK ARTHROSCOPY KNEE (MISCELLANEOUS) ×2 IMPLANT
PAD ABD DERMACEA PRESS 5X9 (GAUZE/BANDAGES/DRESSINGS) ×4 IMPLANT
PAD WRAPON POLAR KNEE (MISCELLANEOUS) ×1 IMPLANT
PADDING CAST 6X4YD NS (MISCELLANEOUS) ×1
PADDING CAST COTTON 6X4 NS (MISCELLANEOUS) ×1 IMPLANT
PENCIL ELECTRO HAND CTR (MISCELLANEOUS) ×2 IMPLANT
PULSAVAC PLUS IRRIG FAN TIP (DISPOSABLE) ×2
SET TUBE SUCT SHAVER OUTFL 24K (TUBING) ×2 IMPLANT
SET TUBE TIP INTRA-ARTICULAR (MISCELLANEOUS) ×2 IMPLANT
SLEEVE PROTECTION STRL DISP (MISCELLANEOUS) ×1 IMPLANT
SPONGE LAP 18X18 RF (DISPOSABLE) ×1 IMPLANT
STOCKINETTE BIAS CUT 6 980064 (GAUZE/BANDAGES/DRESSINGS) ×1 IMPLANT
STRIP CLOSURE SKIN 1/2X4 (GAUZE/BANDAGES/DRESSINGS) ×2 IMPLANT
SUCTION FRAZIER HANDLE 10FR (MISCELLANEOUS) ×1
SUCTION TUBE FRAZIER 10FR DISP (MISCELLANEOUS) IMPLANT
SUT ETHILON 3-0 FS-10 30 BLK (SUTURE) ×2
SUT MNCRL 4-0 (SUTURE) ×1
SUT MNCRL 4-0 27XMFL (SUTURE) ×1
SUT VIC AB 0 CT1 36 (SUTURE) ×2 IMPLANT
SUT VIC AB 0 SH 27 (SUTURE) ×2 IMPLANT
SUT VIC AB 2-0 CT1 (SUTURE) ×1 IMPLANT
SUT VIC AB 2-0 SH 27 (SUTURE) ×1
SUT VIC AB 2-0 SH 27XBRD (SUTURE) ×1 IMPLANT
SUTURE EHLN 3-0 FS-10 30 BLK (SUTURE) ×1 IMPLANT
SUTURE MNCRL 4-0 27XMF (SUTURE) ×1 IMPLANT
SYR BULB IRRIG 60ML STRL (SYRINGE) ×1 IMPLANT
TAPE TRANSPORE STRL 2 31045 (GAUZE/BANDAGES/DRESSINGS) ×2 IMPLANT
TIP FAN IRRIG PULSAVAC PLUS (DISPOSABLE) IMPLANT
TOWEL OR 17X26 4PK STRL BLUE (TOWEL DISPOSABLE) ×4 IMPLANT
TUBING ARTHRO INFLOW-ONLY STRL (TUBING) ×2 IMPLANT
WAND HAND CNTRL MULTIVAC 50 (MISCELLANEOUS) IMPLANT
WAND WEREWOLF FLOW 90D (MISCELLANEOUS) IMPLANT
WRAPON POLAR PAD KNEE (MISCELLANEOUS) ×2

## 2019-02-10 NOTE — H&P (Signed)
Paper H&P to be scanned into permanent record. H&P reviewed. No significant changes noted.  

## 2019-02-10 NOTE — Anesthesia Procedure Notes (Signed)
Procedure Name: LMA Insertion Date/Time: 02/10/2019 7:39 AM Performed by: Allean Found, CRNA Pre-anesthesia Checklist: Patient identified, Patient being monitored, Timeout performed, Emergency Drugs available and Suction available Patient Re-evaluated:Patient Re-evaluated prior to induction Oxygen Delivery Method: Circle system utilized Preoxygenation: Pre-oxygenation with 100% oxygen Induction Type: IV induction Ventilation: Mask ventilation without difficulty LMA: LMA inserted Tube type: Oral Number of attempts: 1 Placement Confirmation: positive ETCO2 and breath sounds checked- equal and bilateral Tube secured with: Tape Dental Injury: Teeth and Oropharynx as per pre-operative assessment

## 2019-02-10 NOTE — Discharge Instructions (Signed)
Post-Op Instructions - Osteochondral Allograft to Femoral Condyle  1. Bracing or crutches: You will be provided with a long brace (from hip to ankle) and crutches at the surgery center.   2. Ice: You will be provided with a device [facility]) that allows you to ice the affected area effectively.   3. Showering: Incision must remain dry for 5 days. Afterwards, you may shower and gently pat incision dry.  NO submerging wound for 4 weeks.   4. Driving: You will be given specific driving precautions at discharge. Plan on not driving for at least one week for left knee surgery, and 4 weeks for right knee surgery if you are restricted due to the brace and knee motion. Please note that you are advised NOT to drive while taking narcotic pain medications as you may be impaired and unsafe to drive.  5. Activity: Weight bearing: No weight bearing on the affected leg for 2 weeks, then 50% weight bearing for 2 weeks, then full weight bearing with crutches at 4 weeks. Bending the knee is limited and will be guided by the physical therapist. Elevate knee above heart level as much as possible for one week. Avoid standing more than 5 minutes (consecutively) for the first week. No exercise involving the knee until cleared by the surgeon or physical therapist.  Avoid long distance travel for 4 weeks.  6. Medications: - You have been provided a prescription for narcotic pain medicine. After surgery, take 1-2 narcotic tablets every 4 hours if needed for severe pain.  - A prescription for anti-nausea medication will be provided in case the narcotic medicine causes nausea - take 1 tablet every 6 hours only if nauseated.  - Take enteric coated aspirin 325 mg once daily for 4 weeks to prevent blood clots.  -Take tylenol 1000 every 8 hours for pain.  May stop tylenol 3 days after surgery or when you are having minimal pain. -DO NOT TAKE IBUPROFEN, ALEVE or OTHER NSAIDs as they can interfere with bone healing.  -Take  Citracal Maximum Strength (calcium citrate + vitamin D), 2 tabs daily. This can be obtained over the counter.  If you are taking prescription medication for anxiety, depression, insomnia, muscle spasm, chronic pain, or for attention deficit disorder, you are advised that you are at a higher risk of adverse effects with use of narcotics post-op, including narcotic addiction/dependence, depressed breathing, death. If you use non-prescribed substances: alcohol, marijuana, cocaine, heroin, methamphetamines, etc., you are at a higher risk of adverse effects with use of narcotics post-op, including narcotic addiction/dependence, depressed breathing, death. You are advised that taking > 50 morphine milligram equivalents (MME) of narcotic pain medication per day results in twice the risk of overdose or death. For your prescription provided: oxycodone 5 mg - taking more than 6 tablets per day would result in > 50 morphine milligram equivalents (MME) of narcotic pain medication. Be advised that we will prescribe narcotics short-term, for acute post-operative pain, only 3 weeks for major operations such as knee repair/reconstruction surgeries.   7. Bandages: The physical therapist should change the bandages at the first post-op appointment. If needed, the dressing supplies have been provided to you.  8. Physical Therapy: 2 times per week for the first 4 weeks, then 1-2 times per week from weeks 4-8 post-op. Therapy typically starts on post operative Day 3 or 4. You have been provided an order for physical therapy today and should schedule your appointments in advance to avoid delay. The therapist will  provide home exercises.  9. Work or School: For most, but not all procedures, we advise staying out of work or school for at least 1 to 2 weeks in order to recover from the stress of surgery and to allow time for healing and swelling control. For more active jobs, you may need to remain out for longer. Please discuss  with Dr. Posey Pronto if you have not already done so. If you need a work or school note this can be provided.   10. Post-Op Appointments: Your first post-op appointment will be with Dr. Posey Pronto in approximately 2 weeks time. Please double check if this will be at the [facility] facility (Tuesdays and Thursdays) or [place] facility (Wednesdays).    If you find that they have not been scheduled please call the Orthopaedic Appointment front desk at [phone].  AMBULATORY SURGERY  DISCHARGE INSTRUCTIONS   1) The drugs that you were given will stay in your system until tomorrow so for the next 24 hours you should not:  A) Drive an automobile B) Make any legal decisions C) Drink any alcoholic beverage   2) You may resume regular meals tomorrow.  Today it is better to start with liquids and gradually work up to solid foods.  You may eat anything you prefer, but it is better to start with liquids, then soup and crackers, and gradually work up to solid foods.   3) Please notify your doctor immediately if you have any unusual bleeding, trouble breathing, redness and pain at the surgery site, drainage, fever, or pain not relieved by medication.    4) Additional Instructions:        Please contact your physician with any problems or Same Day Surgery at [phone], Monday through Friday 6 am to 4 pm, or [place] [facility] number at [phone].

## 2019-02-10 NOTE — Transfer of Care (Signed)
Immediate Anesthesia Transfer of Care Note  Patient: Jose Boyle.  Procedure(s) Performed: KNEE ARTHROSCOPY WITH LOOSE BODY REMOVAL, CHONDROPLASTY, OSTEOCHONDRAL ALLOGRAFT MEDIAL FEMORAL CONDYLE (Right Knee)  Patient Location: PACU  Anesthesia Type:General  Level of Consciousness: awake, alert  and oriented  Airway & Oxygen Therapy: Patient Spontanous Breathing and Patient connected to face mask oxygen  Post-op Assessment: Report given to RN and Post -op Vital signs reviewed and stable  Post vital signs: Reviewed and stable  Last Vitals:  Vitals Value Taken Time  BP 123/78 02/10/19 1041  Temp    Pulse 87 02/10/19 1045  Resp 16 02/10/19 1045  SpO2 95 % 02/10/19 1045  Vitals shown include unvalidated device data.  Last Pain:  Vitals:   02/10/19 0611  TempSrc: Tympanic  PainSc: 5          Complications: No apparent anesthesia complications

## 2019-02-10 NOTE — Anesthesia Procedure Notes (Addendum)
Anesthesia Regional Block: Adductor canal block   Pre-Anesthetic Checklist: ,, timeout performed, Correct Patient, Correct Site, Correct Laterality, Correct Procedure, Correct Position, site marked, Risks and benefits discussed,  Surgical consent,  Pre-op evaluation,  At surgeon's request and post-op pain management  Laterality: Right  Prep: chloraprep       Needles:  Injection technique: Single-shot  Needle Type: Echogenic Needle     Needle Length: 9cm  Needle Gauge: 21     Additional Needles:   Procedures:,,,, ultrasound used (permanent image in chart),,,,  Narrative:  Start time: 02/10/2019 11:40 AM End time: 02/10/2019 11:44 AM Injection made incrementally with aspirations every 5 mL.  Performed by: Personally

## 2019-02-10 NOTE — Anesthesia Preprocedure Evaluation (Signed)
Anesthesia Evaluation  Patient identified by MRN, date of birth, ID band Patient awake    Reviewed: Allergy & Precautions, NPO status , Patient's Chart, lab work & pertinent test results  History of Anesthesia Complications Negative for: history of anesthetic complications  Airway Mallampati: II       Dental   Pulmonary sleep apnea (not using CPAP) , neg COPD, Not current smoker,           Cardiovascular (-) hypertension(-) Past MI and (-) CHF (-) dysrhythmias (-) Valvular Problems/Murmurs     Neuro/Psych neg Seizures    GI/Hepatic Neg liver ROS, neg GERD  ,  Endo/Other  neg diabetes  Renal/GU negative Renal ROS     Musculoskeletal   Abdominal   Peds  Hematology   Anesthesia Other Findings   Reproductive/Obstetrics                             Anesthesia Physical Anesthesia Plan  ASA: II  Anesthesia Plan: General   Post-op Pain Management:    Induction: Intravenous  PONV Risk Score and Plan: 2 and Dexamethasone and Ondansetron  Airway Management Planned: LMA  Additional Equipment:   Intra-op Plan:   Post-operative Plan:   Informed Consent: I have reviewed the patients History and Physical, chart, labs and discussed the procedure including the risks, benefits and alternatives for the proposed anesthesia with the patient or authorized representative who has indicated his/her understanding and acceptance.       Plan Discussed with:   Anesthesia Plan Comments:         Anesthesia Quick Evaluation

## 2019-02-10 NOTE — Progress Notes (Signed)
Block tolerated  well

## 2019-02-10 NOTE — Op Note (Addendum)
Operative Note  DATE: 02/10/2019  PRE-OP DIAGNOSIS:  1. 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 Right knee medial meniscus tear  PROCEDURES: 1. Right knee medial femoral condyle osteochondral allograft (63mm x 1) 2. Right knee arthroscopy with partial medial meniscectomy   SURGEON:  Rosealee Albee, MD  ASSISTANT(S):  Sonny Dandy, PA  ANESTHESIA: Gen   TOTAL IV FLUIDS: see anesthesia record  ESTIMATED BLOOD LOSS: 10cc  TOURNIQUET TIME: 99 minutes.  DRAINS: None  SPECIMENS: None.  IMPLANTS: JRF Ortho Precut Femoral Core, 49mm  COMPLICATIONS: None apparent.  INDICATIONS: Jose Boyle. is a 38 y.o. male with persistent knee pain for over 4 months.  Initial MRI showed an osteochondral defect of the medial femoral condyle with significant bone marrow edema about the distal femur.  He was treated initially nonoperatively until bone marrow edema resolved.  Repeat MRI was obtained which confirmed this and persistence of a medial femoral condyle osteochondral defect.  The patient had continued pain and swelling with difficulty with daily activities. Standing alignment films showed a relatively neutral mechanical axis. There were no significant degenerative changes on radiographs. After discussion of risks, benefits, and alternatives to surgery, the patient elected to proceed with surgical intervention.   OPERATIVE FINDINGS:    Examination under anesthesia: A careful examination under anesthesia was performed.  Passive range of motion was: Hyperextension: 2.  Extension: 0.  Flexion: 130.  Lachman: normal. Pivot Shift: normal.  Posterior drawer: normal.  Varus stability in full extension: normal.  Varus stability in 30 degrees of flexion: normal.  Valgus stability in full extension: normal.  Valgus stability in 30 degrees of flexion: normal.   Intra-operative findings: A thorough arthroscopic  examination of the knee was performed.  The findings are: 1. Suprapatellar pouch: Normal 2. Undersurface of median ridge: Grade 1 softening 3. Medial patellar facet: Grade 1 softening 4. Lateral patellar facet: Grade 1 softening 5. Trochlea: Normal 6. Lateral gutter/popliteus tendon: Normal 7. Hoffa's fat pad: Normal 8. Medial gutter/plica: Normal 9. ACL: Normal 10. PCL: Normal 11. Medial meniscus: Small radial tear of the posterior horn/body junction involving the white-white zone only with a small horizontal tear involving the white white zone only in the posterior horn extending towards the meniscal root. 12. Medial compartment cartilage: Significant osteochondral lesion measuring ~15x10 on the medial femoral condyle adjacent to the intercondylar notch.  Normal tibial articular cartilage 13. Lateral meniscus: Normal 14. Lateral compartment cartilage: Normal   OPERATIVE REPORT:     I identified Jose T [employer]. in the pre-operative holding area. I marked the operative knee with my initials. I reviewed the risks and benefits of the proposed surgical intervention and the patient (and/or patient's guardian) wished to proceed. The patient was transferred to the operative suite and placed in the supine position with all bony prominences padded.  Anesthesia was administered. Appropriate IV antibiotics were administered prior to incision. The extremity was then prepped and draped in standard fashion. A time out was performed confirming the correct extremity, correct patient, and correct procedure.    Arthroscopy portals were marked. Local anesthetic was injected to the planned portal sites. The thigh tourniquet was inflated to 250 mm Hg after elevating and exsanguinating. The anterolateral portal was established with an 11 blade.      The arthroscope was placed in the anterolateral portal and then into the suprapatellar pouch.  A diagnostic knee scope was completed  with the above findings. Next,  the medial portal was established under needle localization. 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  The posterior horn of the medial meniscus was debrided with a combination of an oscillating shaver and arthroscopic biters until stable edges were obtained.  After partial meniscectomy, approximately 80% of the posterior horn remained intact. Arthroscopic fluid was removed from the joint.   Next, we turned our attention to the open osteonchondral allograft portion of the procedure. A longitudinal incision just medial to the midline was utilized, incorporating the medial portal incision. A medial parapatellar arthrotomy was performed, including part of the quadriceps insertion on the patella. Synovitis was resected. Care was taken to avoid cutting the medial meniscus. 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 Next, a guidepin was placed through the center of the dilator once it was centered and perpendicular to the lesion. The 36mm reamer was then placed over the guide pin and reamed to a depth of 6-20mm. The dilator was then placed. Depth was measured at 12:00, 3 o'clock, 6 o'clock, and 9 o'clock positions.  The precut 66mm allograft plug was then cut with a sagittal saw. A rongeur was used to fine-tune the depths of the osteochondral allograft plug. The plug was then pulse-lavaged. The graft was then carefully press fit and tamped flush with surrounding cartilage. The contour matched well and the press-fit did not require any additional fixation such as a compression screw. The knee was taken through a range of motion and the graft remained in place without any notable impingement.   The tourniquet was let down.  There was no unusual bleeding. The arthrotomy/midline incision was closed with interrupted 0 Vicryl sutures in figure-of-eight fashion, then closing the skin with interrupted inverted 2-0 Vicryl in subcutaneous tissue and 4-0 Monocryl with Dermabond for the skin. 3-0 Nylon was  used to close the arthroscopic portals. Xeroform gauze, sterile dressings, Polar Care, and brace locked at 0 degrees were applied to the knee.  Instrument, sponge, and needle counts were correct prior to wound closure and at the conclusion of the case.  Of note, assistance from a PA was essential to performing the surgery. PA assisted with patient positioning, retraction, and instrumentation. The surgery would have been more difficult and had longer operative time without PA assistance.    DISPOSITION: PACU - hemodynamically stable.  POSTOPERATIVE PLAN: The patient will be discharged home after abductor canal nerve block is performed by the anesthesia team. ASA for DVT ppx. Perioperative antibiotics x 24 hours. Use femoral condyle osteochondral allograft rehab protocol. Nonweightbearing for 2 weeks postoperatively and then 50% WB for 2 weeks.  May start to bear weight as tolerated at 4 weeks with crutches and then wean from crutches as tolerated.  Physical therapy to start on postoperative day #3-4.  Follow-up with me in approximately 2 weeks.

## 2019-02-10 NOTE — Progress Notes (Signed)
Pt states pain is "tolerable" and is better.  Pt able to rest quietly in bed with eyes closed.  All needs met.

## 2019-02-10 NOTE — Anesthesia Post-op Follow-up Note (Signed)
Anesthesia QCDR form completed.        

## 2019-02-10 NOTE — Anesthesia Postprocedure Evaluation (Signed)
Anesthesia Post Note  Patient: Jose Boyle.  Procedure(s) Performed: KNEE ARTHROSCOPY WITH LOOSE BODY REMOVAL, CHONDROPLASTY, OSTEOCHONDRAL ALLOGRAFT MEDIAL FEMORAL CONDYLE (Right Knee)  Patient location during evaluation: PACU Anesthesia Type: General Level of consciousness: awake and alert Pain management: pain level controlled Vital Signs Assessment: post-procedure vital signs reviewed and stable Respiratory status: spontaneous breathing and respiratory function stable Cardiovascular status: stable Anesthetic complications: no     Last Vitals:  Vitals:   02/10/19 1215 02/10/19 1234  BP:  126/71  Pulse: 85 95  Resp: 15 15  Temp:  (!) 36.2 C  SpO2: 98% 97%    Last Pain:  Vitals:   02/10/19 1234  TempSrc:   PainSc: 8                  KEPHART,WILLIAM K

## 2019-02-11 ENCOUNTER — Encounter: Payer: Self-pay | Admitting: Emergency Medicine

## 2019-02-11 ENCOUNTER — Emergency Department
Admission: EM | Admit: 2019-02-11 | Discharge: 2019-02-11 | Disposition: A | Discharge status: Home / Self Care | Payer: 59 | Attending: Emergency Medicine | Admitting: Emergency Medicine

## 2019-02-11 DIAGNOSIS — Z79899 Other long term (current) drug therapy: Secondary | ICD-10-CM | POA: Diagnosis not present

## 2019-02-11 DIAGNOSIS — G8918 Other acute postprocedural pain: Secondary | ICD-10-CM | POA: Diagnosis not present

## 2019-02-11 DIAGNOSIS — M25561 Pain in right knee: Secondary | ICD-10-CM | POA: Diagnosis present

## 2019-02-11 DIAGNOSIS — R519 Headache, unspecified: Secondary | ICD-10-CM | POA: Insufficient documentation

## 2019-02-11 LAB — CBC WITH DIFFERENTIAL/PLATELET
Abs Immature Granulocytes: 0.02 10*3/uL (ref 0.00–0.07)
Basophils Absolute: 0 10*3/uL (ref 0.0–0.1)
Basophils Relative: 0 %
Eosinophils Absolute: 0 10*3/uL (ref 0.0–0.5)
Eosinophils Relative: 1 %
HCT: 42.8 % (ref 39.0–52.0)
Hemoglobin: 14.5 g/dL (ref 13.0–17.0)
Immature Granulocytes: 0 %
Lymphocytes Relative: 13 %
Lymphs Abs: 1 10*3/uL (ref 0.7–4.0)
MCH: 28.7 pg (ref 26.0–34.0)
MCHC: 33.9 g/dL (ref 30.0–36.0)
MCV: 84.6 fL (ref 80.0–100.0)
Monocytes Absolute: 1.1 10*3/uL — ABNORMAL HIGH (ref 0.1–1.0)
Monocytes Relative: 15 %
Neutro Abs: 5.3 10*3/uL (ref 1.7–7.7)
Neutrophils Relative %: 71 %
Platelets: 234 10*3/uL (ref 150–400)
RBC: 5.06 MIL/uL (ref 4.22–5.81)
RDW: 13.2 % (ref 11.5–15.5)
WBC: 7.5 10*3/uL (ref 4.0–10.5)
nRBC: 0 % (ref 0.0–0.2)

## 2019-02-11 LAB — BASIC METABOLIC PANEL
Anion gap: 10 (ref 5–15)
BUN: 9 mg/dL (ref 6–20)
CO2: 26 mmol/L (ref 22–32)
Calcium: 9 mg/dL (ref 8.9–10.3)
Chloride: 102 mmol/L (ref 98–111)
Creatinine, Ser: 0.94 mg/dL (ref 0.61–1.24)
GFR calc Af Amer: >60 mL/min (ref >60)
GFR calc non Af Amer: >60 mL/min (ref >60)
Glucose, Bld: 111 mg/dL — ABNORMAL HIGH (ref 70–99)
Potassium: 3.6 mmol/L (ref 3.5–5.1)
Sodium: 138 mmol/L (ref 135–145)

## 2019-02-11 LAB — SEDIMENTATION RATE: Sed Rate: 7 mm/hr (ref 0–15)

## 2019-02-11 MED ORDER — KETOROLAC TROMETHAMINE 10 MG PO TABS: 10.0000 mg | ORAL_TABLET | Freq: Four times a day (QID) | ORAL | 0 refills | Status: DC | PRN

## 2019-02-11 MED ORDER — KETOROLAC TROMETHAMINE 30 MG/ML IJ SOLN
30.0000 mg | Freq: Once | INTRAMUSCULAR | Status: AC
  Administered 2019-02-11: 11:00:00 30 mg via INTRAVENOUS
  Filled 2019-02-11: qty 1

## 2019-02-11 MED ORDER — HYDROMORPHONE HCL 1 MG/ML IJ SOLN
1.0000 mg | Freq: Once | INTRAMUSCULAR | Status: AC
  Administered 2019-02-11: 1 mg via INTRAVENOUS
  Filled 2019-02-11: qty 1

## 2019-02-11 NOTE — ED Notes (Signed)
Pt verbalized understanding of discharge instructions. NAD at this time. 

## 2019-02-11 NOTE — Discharge Instructions (Addendum)
Continue previous medications and discharge instructions from the orthopedic surgeon.  Start Toradol as directed.

## 2019-02-11 NOTE — ED Triage Notes (Signed)
Pt to ED by EMS from home with c/o of left knee pain. Pt had left knee surgery yesterday. Pt states pain is unbearable. Pt taking pain medication as prescribed w/o relief.
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
## 2019-03-07 ENCOUNTER — Telehealth: Payer: Self-pay | Admitting: Family Medicine

## 2019-03-07 NOTE — Telephone Encounter (Signed)
PA sent to plan for Cialis. Waiting response

## 2019-07-22 ENCOUNTER — Other Ambulatory Visit: Payer: Self-pay

## 2019-07-22 ENCOUNTER — Ambulatory Visit
Admission: EM | Admit: 2019-07-22 | Discharge: 2019-07-22 | Disposition: A | Discharge status: Home / Self Care | Payer: 59 | Attending: Family Medicine | Admitting: Family Medicine

## 2019-07-22 ENCOUNTER — Encounter: Payer: Self-pay | Admitting: Emergency Medicine

## 2019-07-22 DIAGNOSIS — T162XXA Foreign body in left ear, initial encounter: Secondary | ICD-10-CM | POA: Diagnosis not present

## 2019-07-22 NOTE — ED Triage Notes (Signed)
Patient states that he has the cushion piece of his ear piece stuck in his left ear today.
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
## 2019-09-20 ENCOUNTER — Other Ambulatory Visit: Payer: Self-pay

## 2019-09-20 ENCOUNTER — Ambulatory Visit: Payer: 59 | Admitting: Urology

## 2019-10-02 ENCOUNTER — Other Ambulatory Visit: Payer: Self-pay | Admitting: Urology

## 2020-01-24 IMAGING — CR DG BONE LENGTH
1 series · 5 of 5 positions shown · non-contrast
Comparison: None.

CLINICAL DATA: Preop planning for surgery.

EXAM:
BONE LENGTH

[Series 1: dg bone length · 0.14mm/px · 5 of 5 slices shown]
[im 1/5]
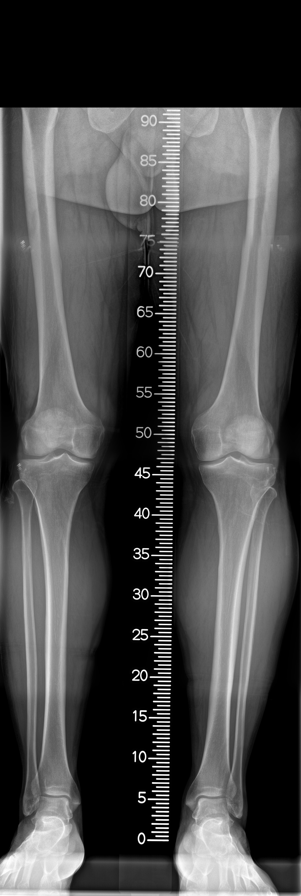
[im 2/5]
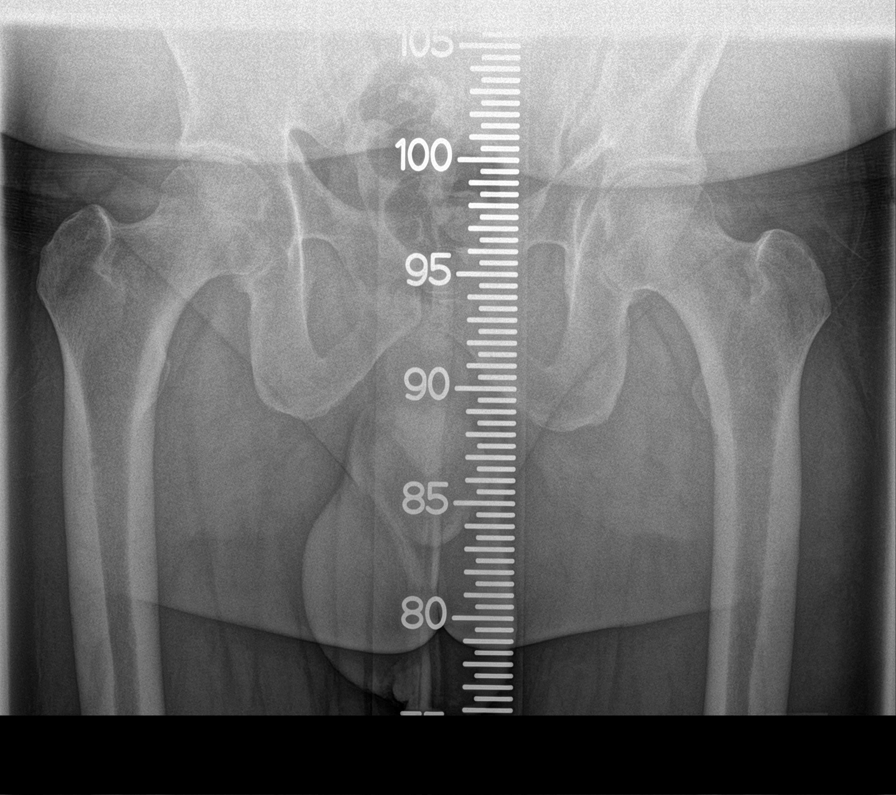
[im 3/5]
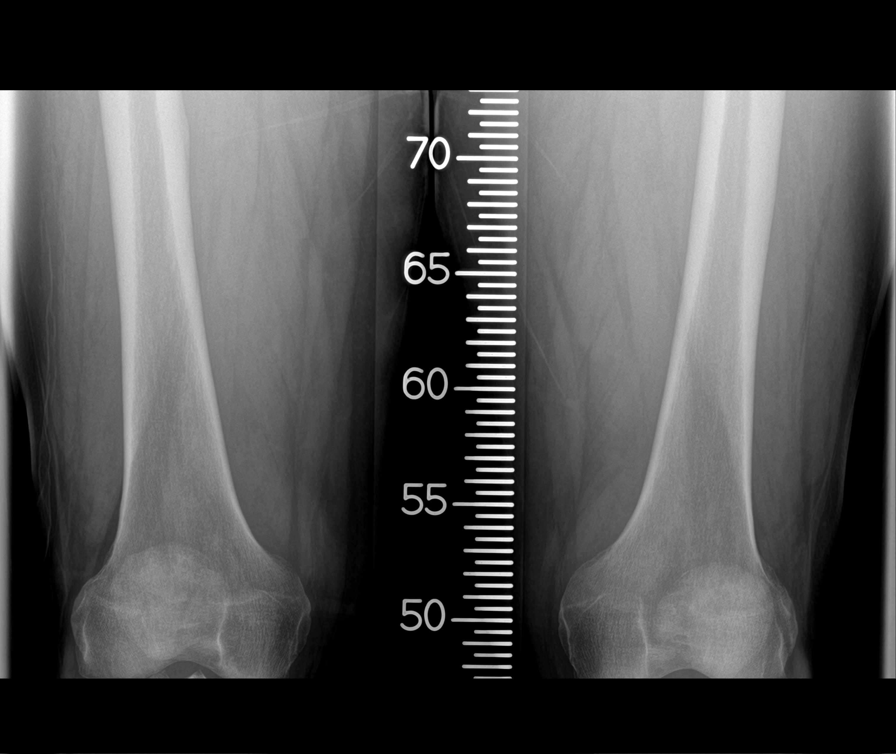
[im 4/5]
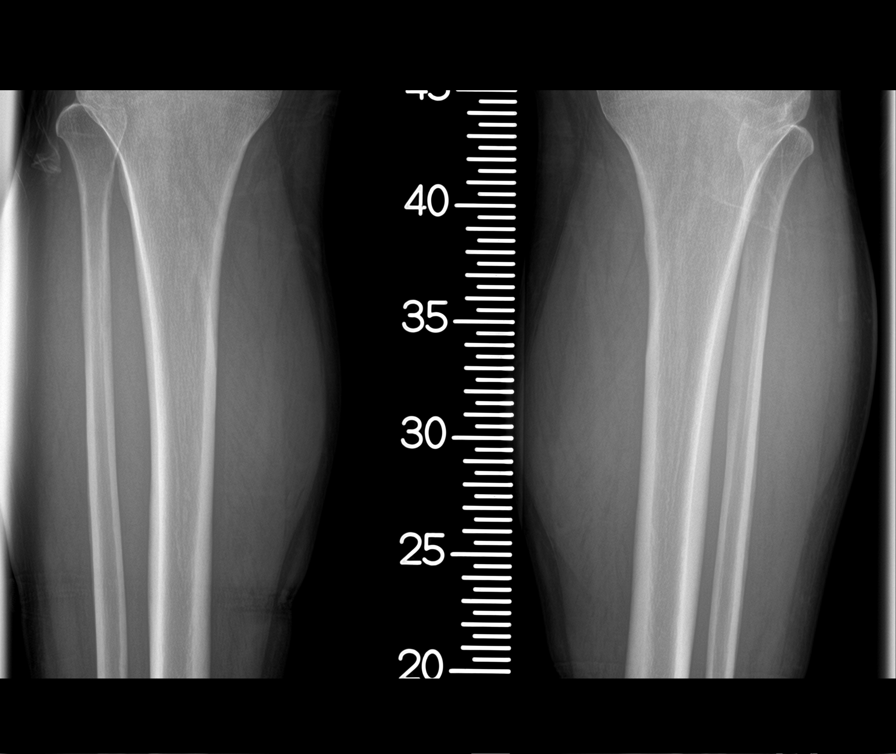
[im 5/5]
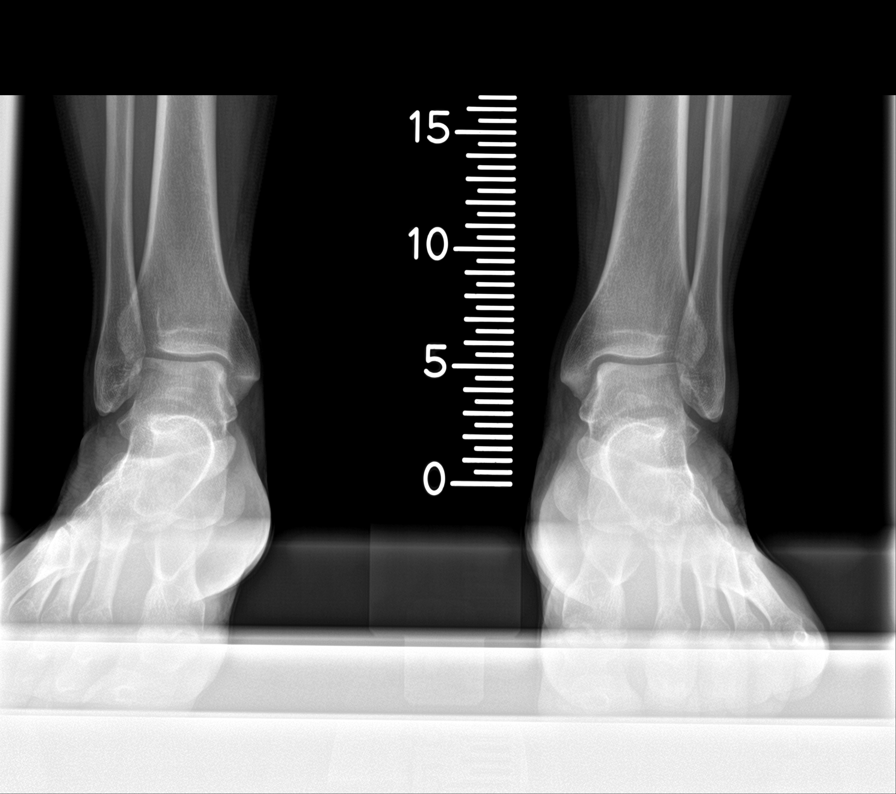

[5 of 5 positions shown; findings below may reference images not displayed]

FINDINGS: No acute fracture or dislocation. No aggressive osseous lesion.
Joint spaces are relatively well maintained.

Right lower extremity measures 94 cm in length from the superior
aspect of the femoral head to the mid tibial plafond. Left lower
extremity measures 93 cm in length from the superior aspect of the
femoral head to the mid tibial plafond.

Slight genu varus of left leg at the knee.
IMPRESSION: No acute osseous injury of the bilateral lower legs.

## 2020-02-02 DIAGNOSIS — L299 Pruritus, unspecified: Secondary | ICD-10-CM | POA: Insufficient documentation

## 2020-02-15 DIAGNOSIS — E66811 Obesity, class 1: Secondary | ICD-10-CM | POA: Insufficient documentation

## 2020-04-17 ENCOUNTER — Encounter: Payer: Self-pay | Admitting: Emergency Medicine

## 2020-04-17 ENCOUNTER — Ambulatory Visit
Admission: EM | Admit: 2020-04-17 | Discharge: 2020-04-17 | Disposition: A | Discharge status: Home / Self Care | Payer: BLUE CROSS/BLUE SHIELD | Attending: Family Medicine | Admitting: Family Medicine

## 2020-04-17 ENCOUNTER — Other Ambulatory Visit: Payer: Self-pay

## 2020-04-17 ENCOUNTER — Encounter: Payer: Self-pay | Admitting: Family Medicine

## 2020-04-17 DIAGNOSIS — M79675 Pain in left toe(s): Secondary | ICD-10-CM

## 2020-04-17 MED ORDER — PREDNISONE 10 MG PO TABS: ORAL_TABLET | ORAL | 0 refills | Status: DC

## 2020-04-17 NOTE — Discharge Instructions (Addendum)
I suspect that you have gout.  Medication as prescribed.  Rest, ice, elevation.  Take care  Dr. Adriana Simas
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
## 2020-04-17 NOTE — ED Triage Notes (Signed)
Patient in today c/o left great toe/foot  pain at the joint x 1 week. No specific injury noted, but patient states he stands a lot at work. Patient has been using an ACE wrap, ice and OTC pain relief cream.

## 2020-04-29 ENCOUNTER — Ambulatory Visit: Payer: 59 | Admitting: Dietician

## 2020-05-10 ENCOUNTER — Encounter: Payer: Self-pay | Admitting: Dietician

## 2020-05-10 NOTE — Progress Notes (Signed)
Have not heard back from patient to reschedule his cancelled appointment from 04/29/20. Sent notification to referring provider.

## 2020-07-15 ENCOUNTER — Ambulatory Visit (INDEPENDENT_AMBULATORY_CARE_PROVIDER_SITE_OTHER): Payer: BLUE CROSS/BLUE SHIELD | Admitting: Urology

## 2020-07-15 ENCOUNTER — Other Ambulatory Visit: Payer: Self-pay | Admitting: *Deleted

## 2020-07-15 ENCOUNTER — Other Ambulatory Visit
Admission: RE | Admit: 2020-07-15 | Discharge: 2020-07-15 | Disposition: A | Discharge status: Home / Self Care | Payer: BLUE CROSS/BLUE SHIELD | Attending: Urology | Admitting: Urology

## 2020-07-15 ENCOUNTER — Encounter: Payer: Self-pay | Admitting: Urology

## 2020-07-15 ENCOUNTER — Other Ambulatory Visit: Payer: Self-pay

## 2020-07-15 VITALS — BP 130/95 | HR 102 | Ht 71.0 in | Wt 226.0 lb

## 2020-07-15 DIAGNOSIS — N138 Other obstructive and reflux uropathy: Secondary | ICD-10-CM | POA: Diagnosis not present

## 2020-07-15 DIAGNOSIS — N401 Enlarged prostate with lower urinary tract symptoms: Secondary | ICD-10-CM | POA: Insufficient documentation

## 2020-07-15 LAB — URINALYSIS, COMPLETE (UACMP) WITH MICROSCOPIC
Bilirubin Urine: NEGATIVE
Glucose, UA: NEGATIVE mg/dL
Hgb urine dipstick: NEGATIVE
Ketones, ur: NEGATIVE mg/dL
Leukocytes,Ua: NEGATIVE
Nitrite: NEGATIVE
Protein, ur: NEGATIVE mg/dL
Specific Gravity, Urine: >1.03 — ABNORMAL HIGH (ref 1.005–1.030)
pH: 5.5 (ref 5.0–8.0)

## 2020-07-15 LAB — BLADDER SCAN AMB NON-IMAGING

## 2020-07-15 MED ORDER — SULFAMETHOXAZOLE-TRIMETHOPRIM 800-160 MG PO TABS: 1.0000 | ORAL_TABLET | Freq: Two times a day (BID) | ORAL | 0 refills | Status: AC
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
## 2020-07-17 LAB — URINE CULTURE: Culture: NO GROWTH

## 2020-07-17 MED ORDER — KETOROLAC TROMETHAMINE 10 MG PO TABS: 10.0000 mg | ORAL_TABLET | Freq: Four times a day (QID) | ORAL | 0 refills | Status: AC | PRN
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
## 2020-08-19 ENCOUNTER — Ambulatory Visit: Payer: BLUE CROSS/BLUE SHIELD | Admitting: Urology

## 2020-08-19 DIAGNOSIS — N411 Chronic prostatitis: Secondary | ICD-10-CM

## 2021-10-03 DIAGNOSIS — G8929 Other chronic pain: Secondary | ICD-10-CM | POA: Insufficient documentation

## 2021-10-03 DIAGNOSIS — E538 Deficiency of other specified B group vitamins: Secondary | ICD-10-CM | POA: Insufficient documentation

## 2022-04-20 DIAGNOSIS — F418 Other specified anxiety disorders: Secondary | ICD-10-CM | POA: Insufficient documentation

## 2022-10-12 DIAGNOSIS — R5382 Chronic fatigue, unspecified: Secondary | ICD-10-CM | POA: Insufficient documentation

## 2022-10-12 DIAGNOSIS — G8929 Other chronic pain: Secondary | ICD-10-CM | POA: Insufficient documentation

## 2023-04-14 DIAGNOSIS — M7711 Lateral epicondylitis, right elbow: Secondary | ICD-10-CM | POA: Insufficient documentation

## 2023-04-14 DIAGNOSIS — M778 Other enthesopathies, not elsewhere classified: Secondary | ICD-10-CM | POA: Insufficient documentation

## 2023-04-30 DIAGNOSIS — S66911A Strain of unspecified muscle, fascia and tendon at wrist and hand level, right hand, initial encounter: Secondary | ICD-10-CM | POA: Insufficient documentation

## 2023-05-19 DIAGNOSIS — M25831 Other specified joint disorders, right wrist: Secondary | ICD-10-CM | POA: Insufficient documentation

## 2024-02-04 DIAGNOSIS — B351 Tinea unguium: Secondary | ICD-10-CM | POA: Insufficient documentation

## 2024-02-06 DIAGNOSIS — M899 Disorder of bone, unspecified: Secondary | ICD-10-CM | POA: Insufficient documentation

## 2024-02-06 DIAGNOSIS — Z789 Other specified health status: Secondary | ICD-10-CM | POA: Insufficient documentation

## 2024-02-06 DIAGNOSIS — G894 Chronic pain syndrome: Secondary | ICD-10-CM | POA: Insufficient documentation

## 2024-02-06 DIAGNOSIS — Z79899 Other long term (current) drug therapy: Secondary | ICD-10-CM | POA: Insufficient documentation

## 2024-02-06 NOTE — Patient Instructions (Incomplete)
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
## 2024-02-07 ENCOUNTER — Ambulatory Visit: Payer: Worker's Compensation | Attending: Pain Medicine | Admitting: Pain Medicine

## 2024-02-07 ENCOUNTER — Encounter: Payer: Self-pay | Admitting: Pain Medicine

## 2024-02-07 VITALS — BP 145/98 | HR 89 | Temp 97.2°F | Resp 16 | Ht 71.0 in | Wt 250.0 lb

## 2024-02-07 DIAGNOSIS — G8929 Other chronic pain: Secondary | ICD-10-CM | POA: Diagnosis present

## 2024-02-07 DIAGNOSIS — Z789 Other specified health status: Secondary | ICD-10-CM | POA: Diagnosis present

## 2024-02-07 DIAGNOSIS — M25521 Pain in right elbow: Secondary | ICD-10-CM | POA: Diagnosis present

## 2024-02-07 DIAGNOSIS — Z79899 Other long term (current) drug therapy: Secondary | ICD-10-CM | POA: Insufficient documentation

## 2024-02-07 DIAGNOSIS — M79601 Pain in right arm: Secondary | ICD-10-CM | POA: Diagnosis present

## 2024-02-07 DIAGNOSIS — G8928 Other chronic postprocedural pain: Secondary | ICD-10-CM | POA: Insufficient documentation

## 2024-02-07 DIAGNOSIS — G894 Chronic pain syndrome: Secondary | ICD-10-CM | POA: Insufficient documentation

## 2024-02-07 DIAGNOSIS — M899 Disorder of bone, unspecified: Secondary | ICD-10-CM | POA: Diagnosis present

## 2024-02-07 DIAGNOSIS — M25531 Pain in right wrist: Secondary | ICD-10-CM | POA: Insufficient documentation

## 2024-02-07 DIAGNOSIS — M792 Neuralgia and neuritis, unspecified: Secondary | ICD-10-CM | POA: Insufficient documentation

## 2024-02-07 MED ORDER — GABAPENTIN 300 MG PO CAPS: 300.0000 mg | ORAL_CAPSULE | Freq: Every day | ORAL | 5 refills | Status: AC

## 2024-02-07 MED ORDER — LIDOCAINE 4 % EX OINT: 1.0000 | TOPICAL_OINTMENT | Freq: Two times a day (BID) | CUTANEOUS | 3 refills | Status: AC

## 2024-02-07 NOTE — Progress Notes (Signed)
 Safety precautions to be maintained throughout the outpatient stay will include: orient to surroundings, keep bed in low position, maintain call bell within reach at all times, provide assistance with transfer out of bed and ambulation.

## 2024-02-15 ENCOUNTER — Telehealth: Payer: Self-pay

## 2024-02-15 ENCOUNTER — Other Ambulatory Visit: Payer: Self-pay | Admitting: Pain Medicine

## 2024-02-15 DIAGNOSIS — G8929 Other chronic pain: Secondary | ICD-10-CM

## 2024-02-15 NOTE — Telephone Encounter (Signed)
 Workers comp called and said you wanted to order a Tens unit for this patient. She wants a copy of the order but there is no order placed. Can you put in the order so I can fax it to her?

## 2024-02-15 NOTE — Telephone Encounter (Signed)
 He can't get one without an order. It is workers occupational hygienist

## 2024-03-16 ENCOUNTER — Other Ambulatory Visit: Payer: Self-pay | Admitting: Pain Medicine

## 2024-03-16 ENCOUNTER — Telehealth: Payer: Self-pay

## 2024-03-16 DIAGNOSIS — G8929 Other chronic pain: Secondary | ICD-10-CM

## 2024-03-16 MED ORDER — TENS THERAPY REPLACE BODY PADS MISC: 2.0000 | 99 refills | Status: AC | PRN

## 2024-03-16 NOTE — Telephone Encounter (Signed)
 Sorry I dont see an order anywhere.

## 2024-03-16 NOTE — Telephone Encounter (Signed)
 Workers designer, industrial/product called and said they got the tens unit order but patient needs tens unit supplies and they cannot get them without an order from you requesting tens unit supplies. Can you put in an order and I will fax it to the adjuster.
